# Patient Record
Sex: Male | Born: 1960 | Race: White | Hispanic: No | Marital: Married | Smoking: Former smoker
Health system: Southern US, Community
[De-identification: ages and names within clinical notes are randomized; demographics above are authoritative.]

## PROBLEM LIST (undated history)

## (undated) DIAGNOSIS — I10 Essential (primary) hypertension: Secondary | ICD-10-CM

## (undated) DIAGNOSIS — Z9189 Other specified personal risk factors, not elsewhere classified: Secondary | ICD-10-CM

## (undated) DIAGNOSIS — Z8489 Family history of other specified conditions: Secondary | ICD-10-CM

## (undated) DIAGNOSIS — E785 Hyperlipidemia, unspecified: Secondary | ICD-10-CM

## (undated) DIAGNOSIS — Z973 Presence of spectacles and contact lenses: Secondary | ICD-10-CM

## (undated) DIAGNOSIS — K648 Other hemorrhoids: Secondary | ICD-10-CM

## (undated) DIAGNOSIS — J302 Other seasonal allergic rhinitis: Secondary | ICD-10-CM

## (undated) DIAGNOSIS — N200 Calculus of kidney: Secondary | ICD-10-CM

## (undated) DIAGNOSIS — J3089 Other allergic rhinitis: Secondary | ICD-10-CM

## (undated) HISTORY — DX: Essential (primary) hypertension: I10

## (undated) HISTORY — PX: COLONOSCOPY: SHX174

## (undated) HISTORY — DX: Calculus of kidney: N20.0

---

## 2008-03-03 ENCOUNTER — Ambulatory Visit: Payer: Self-pay | Admitting: Internal Medicine

## 2008-03-03 ENCOUNTER — Ambulatory Visit: Payer: Self-pay

## 2008-03-03 LAB — CONVERTED CEMR LAB: TSH: 1.19 microintl units/mL (ref 0.35–5.50)

## 2008-03-09 ENCOUNTER — Ambulatory Visit (HOSPITAL_COMMUNITY): Admission: RE | Admit: 2008-03-09 | Discharge: 2008-03-09 | Payer: Self-pay | Admitting: Internal Medicine

## 2008-03-17 ENCOUNTER — Encounter: Payer: Self-pay | Admitting: Internal Medicine

## 2008-03-17 ENCOUNTER — Ambulatory Visit: Payer: Self-pay

## 2008-03-17 HISTORY — PX: TRANSTHORACIC ECHOCARDIOGRAM: SHX275

## 2008-03-30 ENCOUNTER — Other Ambulatory Visit: Admission: RE | Admit: 2008-03-30 | Discharge: 2008-03-30 | Payer: Self-pay | Admitting: Interventional Radiology

## 2008-03-30 ENCOUNTER — Encounter: Admission: RE | Admit: 2008-03-30 | Discharge: 2008-03-30 | Payer: Self-pay | Admitting: Internal Medicine

## 2008-03-31 ENCOUNTER — Encounter (INDEPENDENT_AMBULATORY_CARE_PROVIDER_SITE_OTHER): Payer: Self-pay | Admitting: Interventional Radiology

## 2008-06-08 ENCOUNTER — Ambulatory Visit: Payer: Self-pay | Admitting: Internal Medicine

## 2008-06-08 LAB — CONVERTED CEMR LAB
ALT: 43 units/L (ref 0–53)
AST: 39 units/L — ABNORMAL HIGH (ref 0–37)
Alkaline Phosphatase: 50 units/L (ref 39–117)
Calcium: 9.1 mg/dL (ref 8.4–10.5)
Cholesterol: 178 mg/dL (ref 0–200)
Creatinine, Ser: 1.1 mg/dL (ref 0.4–1.5)
HDL: 43.4 mg/dL (ref >39.00)
LDL Cholesterol: 118 mg/dL — ABNORMAL HIGH (ref 0–99)
Total Bilirubin: 0.8 mg/dL (ref 0.3–1.2)
Total CHOL/HDL Ratio: 4
Triglycerides: 83 mg/dL (ref 0.0–149.0)

## 2008-07-26 ENCOUNTER — Ambulatory Visit: Payer: Self-pay | Admitting: Internal Medicine

## 2008-07-26 DIAGNOSIS — E785 Hyperlipidemia, unspecified: Secondary | ICD-10-CM

## 2008-10-13 ENCOUNTER — Encounter: Payer: Self-pay | Admitting: Internal Medicine

## 2008-12-16 ENCOUNTER — Ambulatory Visit: Payer: Self-pay | Admitting: Internal Medicine

## 2008-12-16 DIAGNOSIS — J309 Allergic rhinitis, unspecified: Secondary | ICD-10-CM

## 2008-12-16 DIAGNOSIS — R42 Dizziness and giddiness: Secondary | ICD-10-CM | POA: Insufficient documentation

## 2008-12-28 ENCOUNTER — Telehealth: Payer: Self-pay | Admitting: Internal Medicine

## 2009-01-04 ENCOUNTER — Encounter (INDEPENDENT_AMBULATORY_CARE_PROVIDER_SITE_OTHER): Payer: Self-pay | Admitting: *Deleted

## 2009-04-13 ENCOUNTER — Encounter: Payer: Self-pay | Admitting: Internal Medicine

## 2009-04-14 ENCOUNTER — Ambulatory Visit: Payer: Self-pay | Admitting: Family Medicine

## 2009-04-14 DIAGNOSIS — R109 Unspecified abdominal pain: Secondary | ICD-10-CM

## 2009-04-14 LAB — CONVERTED CEMR LAB
Glucose, Urine, Semiquant: NEGATIVE
Nitrite: NEGATIVE
Protein, U semiquant: NEGATIVE
Specific Gravity, Urine: 1.01
Urobilinogen, UA: 0.2

## 2009-04-17 ENCOUNTER — Encounter: Admission: RE | Admit: 2009-04-17 | Discharge: 2009-04-17 | Payer: Self-pay | Admitting: Allergy and Immunology

## 2010-01-08 ENCOUNTER — Telehealth: Payer: Self-pay | Admitting: Internal Medicine

## 2010-01-30 ENCOUNTER — Ambulatory Visit: Payer: Self-pay | Admitting: Internal Medicine

## 2010-01-30 LAB — CONVERTED CEMR LAB
AST: 39 units/L — ABNORMAL HIGH (ref 0–37)
Albumin: 3.9 g/dL (ref 3.5–5.2)
Basophils Absolute: 0 10*3/uL (ref 0.0–0.1)
Basophils Relative: 0.3 % (ref 0.0–3.0)
CO2: 28 meq/L (ref 19–32)
Cholesterol: 190 mg/dL (ref 0–200)
Eosinophils Relative: 2 % (ref 0.0–5.0)
GFR calc non Af Amer: 95.15 mL/min (ref >60.00)
Glucose, Bld: 106 mg/dL — ABNORMAL HIGH (ref 70–99)
Hemoglobin: 14 g/dL (ref 13.0–17.0)
Ketones, ur: NEGATIVE mg/dL
Leukocytes, UA: NEGATIVE
Lymphocytes Relative: 31.8 % (ref 12.0–46.0)
Lymphs Abs: 2.2 10*3/uL (ref 0.7–4.0)
MCHC: 34.8 g/dL (ref 30.0–36.0)
Monocytes Relative: 8.2 % (ref 3.0–12.0)
Neutro Abs: 4 10*3/uL (ref 1.4–7.7)
Nitrite: NEGATIVE
PSA: 1.61 ng/mL (ref 0.10–4.00)
Platelets: 287 10*3/uL (ref 150.0–400.0)
RDW: 12.4 % (ref 11.5–14.6)
Total Bilirubin: 0.7 mg/dL (ref 0.3–1.2)
Triglycerides: 147 mg/dL (ref 0.0–149.0)
VLDL: 29.4 mg/dL (ref 0.0–40.0)
WBC: 6.9 10*3/uL (ref 4.5–10.5)

## 2010-02-05 ENCOUNTER — Ambulatory Visit: Payer: Self-pay | Admitting: Internal Medicine

## 2010-02-13 ENCOUNTER — Ambulatory Visit: Payer: Self-pay | Admitting: Internal Medicine

## 2010-02-13 DIAGNOSIS — J069 Acute upper respiratory infection, unspecified: Secondary | ICD-10-CM | POA: Insufficient documentation

## 2010-02-13 DIAGNOSIS — J029 Acute pharyngitis, unspecified: Secondary | ICD-10-CM

## 2010-03-27 NOTE — Letter (Signed)
Summary: [facility]  [facility]   Imported By: Sherian Rein 04/27/2009 13:40:16  _____________________________________________________________________  External Attachment:    Type:   Image     Comment:   External Document

## 2010-03-27 NOTE — Progress Notes (Signed)
Summary: refill simvastatin  Phone Note Refill Request Message from:  Fax from Pharmacy on January 08, 2010 4:04 PM  Refills Requested: Medication #1:  SIMVASTATIN 40 MG TABS Take one tablet by mouth daily at bedtime vs [facility]   Method Requested: Fax to Local Pharmacy Initial call taken by: Duard Brady LPN,  January 08, 2010 4:05 PM    Prescriptions: SIMVASTATIN 40 MG TABS (SIMVASTATIN) Take one tablet by mouth daily at bedtime  #90 x 0   Entered by:   Duard Brady LPN   Authorized by:   Gordy Savers  MD   Signed by:   Duard Brady LPN on 48/54/6270   Method used:   Historical   RxID:   [id number]  [facility]ed back to [facility]   KIk  will need to be seen - last seen 11/2008

## 2010-03-27 NOTE — Assessment & Plan Note (Signed)
Summary: low back and flank pain/dm   Vital Signs:  Patient profile:   50 year old male Temp:     99.0 degrees F oral BP sitting:   140 / 88  (left arm) Cuff size:   regular  Vitals Entered By: Sid Falcon LPN (April 14, 2009 1:14 PM) CC: Severe stomach, back pains X 2 days Pain Assessment Patient in pain? yes     Location: abdomen Intensity: 8 Type: dull   History of Present Illness: Acute visit. Left flank pain which started 3 AM yesterday. 8/10 severity. Sharp to severe achy pain. Symptoms somewhat off and on since then. Heat might have helped slightly. No clear exacerbating features. Denies any urinary symptoms, gross hematuria, fever, or vomiting. Has had some mild nausea. No stool changes. No history of kidney stones.  Pain severe at onset, subsided for several hours then recurrent severe pain episode this AM.  Currently minimal pain.  Radiated somewhat anterior.  Allergies (verified): No Known Drug Allergies  Past History:  Past Medical History: Last updated: 12/16/2008 1. ETT, January 2010     --13 minutes n Kynsie Falkner. Negative ECG     --ECHO: EF 60% 2. 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 Hyperlipidemia Allergic rhinitis positional vertigo  Social History: Last updated: 07/23/2008  He is married.  He has 3 kids.  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  He drinks 2 cups of large coffee a day.  He   does some exercise as described above.     PMH reviewed for relevance, PSH reviewed for relevance  Review of Systems  The patient denies anorexia, fever, weight loss, chest pain, syncope, dyspnea on exertion, peripheral edema, hemoptysis, melena, hematochezia, severe indigestion/heartburn, hematuria, and incontinence.    Physical Exam  General:  Well-developed,well-nourished,in no acute distress; alert,appropriate and cooperative throughout examination Mouth:  Oral mucosa and  oropharynx without lesions or exudates.  Teeth in good repair. Neck:  No deformities, masses, or tenderness noted. Lungs:  Normal respiratory effort, chest expands symmetrically. Lungs are clear to auscultation, no crackles or wheezes. Heart:  Normal rate and regular rhythm. S1 and S2 normal without gallop, murmur, click, rub or other extra sounds. Abdomen:  soft, non-tender, normal bowel sounds, no distention, no masses, no guarding, no rigidity, no hepatomegaly, and no splenomegaly.   Msk:  no CVA tenderness Psych:  normally interactive, good eye contact, not anxious appearing, and not depressed appearing.     Impression & Recommendations:  Problem # 1:  FLANK PAIN (ICD-789.09)  urine dipstick reveals moderate blood. Negative for nitrites and leukocytes. Rule out kidney stone. Sent for plain films. Pain medication prescribed. CT urogram or urologic referral if no better by early next week Orders: UA Dipstick w/o Micro (manual) (16109) T-1 View Abdomen (KUB) (74000TC)  His updated medication list for this problem includes:    Oxycodone Hcl 10 Mg Tabs (Oxycodone hcl) ..... One by mouth q 6 hours as needed pain  Complete Medication List: 1)  Simvastatin 40 Mg Tabs (Simvastatin) .... Take one tablet by mouth daily at bedtime 2)  Oxycodone Hcl 10 Mg Tabs (Oxycodone hcl) .... One by mouth q 6 hours as needed pain  Patient Instructions: 1)   drink lots of fluids. 2)  follow up promptly if he develops any fever or worsening symptoms 3)  Call or touch based on Monday if symptoms not resolving Prescriptions:  OXYCODONE HCL 10 MG TABS (OXYCODONE HCL) one by mouth q 6 hours as needed pain  #20 x 0   Entered and Authorized by:   Evelena Peat MD   Signed by:   Evelena Peat MD on 04/14/2009   Method used:   Print then Give to Patient   RxID:   [id number]   Laboratory Results   Urine Tests    Routine Urinalysis   Color: yellow Appearance: Clear Glucose: negative   (Normal  Range: Negative) Bilirubin: negative   (Normal Range: Negative) Ketone: negative   (Normal Range: Negative) Spec. Gravity: 1.010   (Normal Range: 1.003-1.035) Blood: moderate   (Normal Range: Negative) pH: 6.0   (Normal Range: 5.0-8.0) Protein: negative   (Normal Range: Negative) Urobilinogen: 0.2   (Normal Range: 0-1) Nitrite: negative   (Normal Range: Negative) Leukocyte Esterace: negative   (Normal Range: Negative)    Comments: Sid Falcon LPN  April 14, 2009 2:04 PM

## 2010-03-29 NOTE — Assessment & Plan Note (Signed)
Summary: CPX // RS   Vital Signs:  Patient profile:   50 year old male Height:      66.5 inches Weight:      194 pounds BMI:     30.95 Temp:     98.3 degrees F oral BP sitting:   130 / 70  (left arm) Cuff size:   regular  Vitals Entered By: Duard Brady LPN (February 05, 2010 1:21 PM) CC: cpx---doing fine  Is Patient Diabetic? No   CC:  cpx---doing fine .  History of Present Illness: 50 year old patient who is seen today for follow-up and for an annual exam.  Complaints include some fatigue and weight gain.  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  Weight is up to 194.  He has a history of mild dyslipidemia, controlled with simvastatin therapy.  Allergies (verified): No Known Drug Allergies  Past History:  Past Medical History: Reviewed history from 12/16/2008 and no changes required. 1. ETT, January 2010     --13 minutes n Bruce. Negative ECG     --ECHO: EF 60% 2. 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 Hyperlipidemia Allergic rhinitis positional vertigo  Past Surgical History: coclonoscopy age 66  Family History: Reviewed history from 12/16/2008 and no changes required.  Father is alive, had a heart attack in his 59s.  Mother   is alive and well.  He has 2 sisters.  There is no family history of   premature coronary artery disease or sudden cardiac death.     both parents are age 86; mother.  History melanoma, as well as basal cell skin cancer;   mother and a sister, status post lumpectomy for breast cancer  Social History: Reviewed history from 07/23/2008 and no changes required.  He is married.  He has 3 kids.  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  He drinks 2 cups of large coffee a day.  He   does some exercise as described above.      Review of Systems       The patient complains of weight gain.  The patient denies anorexia, fever,  weight loss, vision loss, decreased hearing, hoarseness, chest pain, syncope, dyspnea on exertion, peripheral edema, prolonged cough, headaches, hemoptysis, abdominal pain, melena, hematochezia, severe indigestion/heartburn, hematuria, incontinence, genital sores, muscle weakness, suspicious skin lesions, transient blindness, difficulty walking, depression, unusual weight change, abnormal bleeding, enlarged lymph nodes, angioedema, breast masses, and testicular masses.    Physical Exam  General:  overweight-appearing.  122/82overweight-appearing.   Head:  Normocephalic and atraumatic without obvious abnormalities. No apparent alopecia or balding. Eyes:  No corneal or conjunctival inflammation noted. EOMI. Perrla. Funduscopic exam benign, without hemorrhages, exudates or papilledema. Vision grossly normal. Ears:  External ear exam shows no significant lesions or deformities.  Otoscopic examination reveals clear canals, tympanic membranes are intact bilaterally without bulging, retraction, inflammation or discharge. Hearing is grossly normal bilaterally. Nose:  External nasal examination shows no deformity or inflammation. Nasal mucosa are pink and moist without lesions or exudates. Mouth:  Oral mucosa and oropharynx without lesions or exudates.  Teeth in good repair. Neck:  No deformities, masses, or tenderness noted. Chest Wall:  No deformities, masses, tenderness or gynecomastia noted. Breasts:  No masses or gynecomastia noted Lungs:  Normal respiratory effort, chest expands symmetrically. Lungs are clear to auscultation, no crackles or wheezes. Heart:  Normal rate and regular rhythm. S1 and S2 normal without gallop, murmur, click, rub or other extra sounds. Abdomen:  Bowel sounds positive,abdomen soft and non-tender without masses, organomegaly or hernias noted. Rectal:  external hemorrhoid(s).  external hemorrhoid(s).   Genitalia:  Testes bilaterally descended without nodularity, tenderness or  masses. No scrotal masses or lesions. No penis lesions or urethral discharge. Prostate:  Prostate gland firm and smooth, no enlargement, nodularity, tenderness, mass, asymmetry or induration. Msk:  No deformity or scoliosis noted of thoracic or lumbar spine.   Pulses:  R and L carotid,radial,femoral,dorsalis pedis and posterior tibial pulses are full and equal bilaterally Extremities:  No clubbing, cyanosis, edema, or deformity noted with normal full range of motion of all joints.   Neurologic:  No cranial nerve deficits noted. Station and gait are normal. Plantar reflexes are down-going bilaterally. DTRs are symmetrical throughout. Sensory, motor and coordinative functions appear intact. Skin:  Intact without suspicious lesions or rashes Cervical Nodes:  No lymphadenopathy noted Axillary Nodes:  No palpable lymphadenopathy Inguinal Nodes:  No significant adenopathy Psych:  Cognition and judgment appear intact. Alert and cooperative with normal attention span and concentration. No apparent delusions, illusions, hallucinations   Impression & Recommendations:  Problem # 1:  Preventive Health Care (ICD-V70.0)  Complete Medication List: 1)  Simvastatin 40 Mg Tabs (Simvastatin) .... Take one tablet by mouth daily at bedtime  Other Orders: Flu Vaccine 74yrs + (04540) Admin 1st Vaccine (98119)  Patient Instructions: 1)  Please schedule a follow-up appointment in 1 year. 2)  Limit your Sodium (Salt). 3)  It is important that you exercise regularly at least 20 minutes 5 times a week. If you develop chest pain, have severe difficulty breathing, or feel very tired , stop exercising immediately and seek medical attention. 4)  You need to lose weight. Consider a lower calorie diet and regular exercise.  Prescriptions: SIMVASTATIN 40 MG TABS (SIMVASTATIN) Take one tablet by mouth daily at bedtime  #90 x 6   Entered and Authorized by:   Gordy Savers  MD   Signed by:   Gordy Savers  MD  on 02/05/2010   Method used:   Print then Give to Patient   RxID:   [id number] SIMVASTATIN 40 MG TABS (SIMVASTATIN) Take one tablet by mouth daily at bedtime  #90 x 6   Entered and Authorized by:   Gordy Savers  MD   Signed by:   Gordy Savers  MD on 02/05/2010   Method used:   Electronically to        [facility]. [phone]* (retail)       [phone] [address]       [place], [place]  [address]       Ph: [phone]       Fax: [phone]   RxID:   [id number]    Orders Added: 1)  Flu Vaccine 49yrs + [88416] 2)  Admin 1st Vaccine [90471] 3)  Est. Patient 40-64 years [99396]   Immunizations Administered:  Influenza Vaccine # 1:    Vaccine Type: Fluvax 3+    Site: left deltoid    Mfr: GlaxoSmithKline    Dose: 0.5 ml    Route: IM    Given by: Duard Brady LPN    Exp. Date: 08/25/2010    Lot #: [id number]    VIS given: 09/19/09 version given February 05, 2010.    Physician counseled: yes  Flu  Vaccine Consent Questions:    Do you have a history of severe allergic reactions to this vaccine? no    Any prior history of allergic reactions to egg and/or gelatin? no    Do you have a sensitivity to the preservative Thimersol? no    Do you have a past history of Guillan-Barre Syndrome? no    Do you currently have an acute febrile illness? no    Have you ever had a severe reaction to latex? no    Vaccine information given and explained to patient? yes   Immunizations Administered:  Influenza Vaccine # 1:    Vaccine Type: Fluvax 3+    Site: left deltoid    Mfr: GlaxoSmithKline    Dose: 0.5 ml    Route: IM    Given by: Duard Brady LPN    Exp. Date: 08/25/2010    Lot #: [id number]    VIS given: 09/19/09 version given February 05, 2010.    Physician counseled: yes

## 2010-03-29 NOTE — Assessment & Plan Note (Signed)
Summary: ?strep stress/cjr   Vital Signs:  Patient profile:   50 year old male Weight:      191 pounds Temp:     98.5 degrees F oral BP sitting:   112 / 78  (right arm) Cuff size:   regular  Vitals Entered By: Duard Brady LPN (February 13, 2010 1:55 PM) CC: c/o sore throat , 2 kids with strep Is Patient Diabetic? No   CC:  c/o sore throat  and 2 kids with strep.  History of Present Illness: 50  -year-old patient, who presents with a mild sore throatcough, mild rhinorrhea.  Two young children have been diagnosed and are on penicillin for streptococcal pharyngitis.  He also describes mild myalgias.  No documented fever or cervical adenopathy.  He is on simvastatin for dyslipidemia.  He was seen here 8 days ago for an annual exam  Allergies (verified): No Known Drug Allergies  Review of Systems       The patient complains of hoarseness and prolonged cough.  The patient denies anorexia, fever, weight loss, weight gain, vision loss, decreased hearing, chest pain, syncope, dyspnea on exertion, peripheral edema, headaches, hemoptysis, abdominal pain, melena, hematochezia, severe indigestion/heartburn, hematuria, incontinence, genital sores, muscle weakness, suspicious skin lesions, transient blindness, difficulty walking, depression, unusual weight change, abnormal bleeding, enlarged lymph nodes, angioedema, breast masses, and testicular masses.    Physical Exam  General:  Well-developed,well-nourished,in no acute distress; alert,appropriate and cooperative throughout examination Head:  Normocephalic and atraumatic without obvious abnormalities. No apparent alopecia or balding. Eyes:  No corneal or conjunctival inflammation noted. EOMI. Perrla. Funduscopic exam benign, without hemorrhages, exudates or papilledema. Vision grossly normal. Ears:  External ear exam shows no significant lesions or deformities.  Otoscopic examination reveals clear canals, tympanic membranes are intact  bilaterally without bulging, retraction, inflammation or discharge. Hearing is grossly normal bilaterally. Mouth:  marked tonsillar enlargement, but minimal erythema Neck:  no adenopathy Lungs:  Normal respiratory effort, chest expands symmetrically. Lungs are clear to auscultation, no crackles or wheezes.   Impression & Recommendations:  Problem # 1:  URI (ICD-465.9)  Problem # 2:  SORE THROAT (ICD-462)  His updated medication list for this problem includes:    Amoxicillin 500 Mg Caps (Amoxicillin) ..... One capsule 3 times daily with meals patient has an impressive strep exposure.  Will give a prescription for an antibiotic, which he will not fill unless he develops worsening sore throat or fever  His updated medication list for this problem includes:    Amoxicillin 500 Mg Caps (Amoxicillin) ..... One capsule 3 times daily with meals  Complete Medication List: 1)  Simvastatin 40 Mg Tabs (Simvastatin) .... Take one tablet by mouth daily at bedtime 2)  Amoxicillin 500 Mg Caps (Amoxicillin) .... One capsule 3 times daily with meals  Other Orders: Rapid Strep (16109)  Patient Instructions: 1)  Take 400-600mg  of Ibuprofen (Advil, Motrin) with food every 4-6 hours as needed for relief of pain or comfort of fever. 2)  Please schedule a follow-up appointment as needed. Prescriptions: AMOXICILLIN 500 MG CAPS (AMOXICILLIN) one capsule 3 times daily with meals  #21 x 0   Entered and Authorized by:   Gordy Savers  MD   Signed by:   Gordy Savers  MD on 02/13/2010   Method used:   Print then Give to Patient   RxID:   [id number]    Orders Added: 1)  Rapid Strep [95621] 2)  Est. Patient Level II [30865]  Appended Document: ?  strep stress/cjr    Clinical Lists Changes  Observations: Added new observation of RAPID STREP: negative (02/21/2010 9:10)      Laboratory Results  Date/Time Received: 02/13/2010 Date/Time Reported: 02/13/2010  Other Tests  Rapid  Strep: negative

## 2010-03-30 ENCOUNTER — Ambulatory Visit (INDEPENDENT_AMBULATORY_CARE_PROVIDER_SITE_OTHER): Payer: Self-pay | Admitting: Internal Medicine

## 2010-03-30 ENCOUNTER — Encounter: Payer: Self-pay | Admitting: Internal Medicine

## 2010-03-30 VITALS — BP 110/80 | Temp 99.3°F | Ht 68.0 in | Wt 194.0 lb

## 2010-03-30 DIAGNOSIS — J069 Acute upper respiratory infection, unspecified: Secondary | ICD-10-CM

## 2010-03-30 MED ORDER — HYDROCODONE-ACETAMINOPHEN 5-500 MG PO TABS: 1.0000 | ORAL_TABLET | ORAL | Status: AC | PRN

## 2010-03-30 NOTE — Patient Instructions (Signed)
Get plenty of rest, Drink lots of  clear liquids, and use Tylenol or ibuprofen for fever and discomfort.   Take 400-600 mg of ibuprofen ( Advil, Motrin) with food every 4 to 6 hours as needed for pain relief or control of fever

## 2010-03-30 NOTE — Progress Notes (Signed)
  Subjective:    Patient ID: Jesse Horne, male    DOB: [birth year], 50 y.o.   MRN: [id number]  HPI  50 year old patient with a two-day history of URI symptoms.  He has had rhinorrhea red, watery eyes, mild sore throat, and nonproductive cough.  He has had fever head, and chest congestion, and a general sense of fatigue and muscle aches.  He has been using some Advil and Tylenol with marginal benefit.  No strep exposure.  He does have a history of allergic rhinitis    Review of Systems  Constitutional: Positive for fever, chills and fatigue. Negative for appetite change.  HENT: Positive for congestion and rhinorrhea. Negative for hearing loss, ear pain, sore throat, trouble swallowing, neck stiffness, dental problem, voice change and tinnitus.   Eyes: Negative for pain, discharge and visual disturbance.  Respiratory: Positive for cough. Negative for chest tightness, wheezing and stridor.   Cardiovascular: Negative for chest pain, palpitations and leg swelling.  Gastrointestinal: Negative for nausea, vomiting, abdominal pain, diarrhea, constipation, blood in stool and abdominal distention.  Genitourinary: Negative for urgency, hematuria, flank pain, discharge, difficulty urinating and genital sores.  Musculoskeletal: Positive for myalgias. Negative for back pain, joint swelling, arthralgias and gait problem.  Skin: Negative for rash.  Neurological: Negative for dizziness, syncope, speech difficulty, weakness, numbness and headaches.  Hematological: Negative for adenopathy. Does not bruise/bleed easily.  Psychiatric/Behavioral: Negative for behavioral problems and dysphoric mood. The patient is not nervous/anxious.        Objective:   Physical Exam  Constitutional: He is oriented to person, place, and time. He appears well-developed.  HENT:  Head: Normocephalic and atraumatic.  Right Ear: External ear normal.  Left Ear: External ear normal.       Oropharynx was erythematous with tonsillar  enlargement.  No cervical adenopathy  Eyes: Conjunctivae and EOM are normal.  Neck: Normal range of motion. Neck supple.  Cardiovascular: Normal rate, regular rhythm and normal heart sounds.   Pulmonary/Chest: Breath sounds normal. No respiratory distress. He has no wheezes. He has no rales. He exhibits no tenderness.  Abdominal: Bowel sounds are normal. He exhibits no distension. There is no tenderness.  Musculoskeletal: Normal range of motion. He exhibits no edema and no tenderness.  Lymphadenopathy:    He has no cervical adenopathy.  Neurological: He is alert and oriented to person, place, and time.  Skin: Skin is dry. No rash noted.  Psychiatric: He has a normal mood and affect. His behavior is normal.          Assessment & Plan:   1  URI; will to symptomatically was given a prescription for Tylenol with hydrocodone for fever and pain and cough control.  Samples of Clarinex, dispensed he will also use ibuprofen p.r.n.

## 2010-06-18 ENCOUNTER — Other Ambulatory Visit: Payer: Self-pay

## 2010-06-18 ENCOUNTER — Ambulatory Visit: Payer: Private Health Insurance - Indemnity | Admitting: Internal Medicine

## 2010-06-18 DIAGNOSIS — Z0289 Encounter for other administrative examinations: Secondary | ICD-10-CM

## 2010-06-18 MED ORDER — SIMVASTATIN 40 MG PO TABS: 40.0000 mg | ORAL_TABLET | Freq: Every day | ORAL | Status: DC

## 2010-06-18 NOTE — Telephone Encounter (Signed)
Faxed back to [facility]

## 2010-07-10 NOTE — Assessment & Plan Note (Signed)
[facility]                            CARDIOLOGY OFFICE NOTE   NAME:Horne, Jesse                         MRN:          [id number]  DATE:03/03/2008                            DOB:          [birth year]    REASON FOR VISIT:  Palpitations and cardiovascular screening.   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  I have  been asked to see him due to his palpitations and for cardiovascular  screening.   Jesse Horne denies any known history of cardiac disease.  He has never had a  stress test or cardiac catheterization, for about the past 4-5 years, he  has been treated for hyperlipidemia.  His LDL has ranged from 90-140.  He denies any history of hypertension or diabetes.   He is fairly active and has been recently started exercising at the gym.  He denies any chest pain or shortness of breath.  However, he has  noticed a lot of extra beats.  He says that these were just very  transient, I feel an extra beat, they will go away and again they will  come back.  He has not had any sustained palpitations.  No syncope or  presyncope.  He has not had any heart failure symptoms and no chest pain  whatsoever.   REVIEW OF SYSTEMS:  He does have some back pain as he recently hurt his  back shoveling snow.  He also has allergies and hay fever.  The  remainder review of systems is negative except for HPI.   PROBLEM LIST:  Hyperlipidemia.   CURRENT MEDICATIONS:  Simvastatin 40 a day and hydrocodone for his back  pain.   ALLERGIES:  None.   SOCIAL HISTORY:  He is married.  He has 3 kids.  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  He drinks 2 cups of large coffee a day.  He  does some exercise as described above.   FAMILY HISTORY:  Father is alive, had a heart attack in his 27s.  Mother  is alive and well.  He  has 2 sisters.  There is no family history of  premature coronary artery disease or sudden cardiac death.   PHYSICAL EXAMINATION:  GENERAL:  He looks well.  He is in no acute  distress, ambulates in the clinic without any respiratory difficulty.  VITAL SIGNS:  Blood pressure is 128/90, heart rate 78, and weight is  192.  HEENT:  Normal.  NECK:  Supple.  There is no JVD.  Carotids are 2+ bilaterally.  No  bruits.  He does look like he has some symmetrical perhaps mild thyroid  enlargement.  I do not feel a nodule, however, there is no  lymphadenopathy.  CARDIAC:  PMI is nondisplaced.  Regular rate and rhythm.  No murmurs,  rubs, or gallops.  LUNGS:  Clear.  ABDOMEN:  Soft, nontender, and nondistended.  No hepatosplenomegaly, no  bruits, or no  masses.  Good bowel sounds.  EXTREMITIES:  Warm with no cyanosis, clubbing, or edema.  No rash.  NEUROLOGIC:  Alert and oriented x3.  Cranial nerves II-XII are intact.  Moves all 4 extremities without difficulty.  Affect is pleasant.  EKG  shows sinus rhythm at rate of 78, normal axis and intervals.  No ST-T  wave abnormalities.   ASSESSMENT AND PLAN:  1. Palpitations.  I suspect that these are premature atrial      contractions due to caffeine and stress.  However, we will get an      echocardiograms to make sure, he has a structurally normal heart.      Given his possible mild thyroid enlargement, we will also check      thyroid panel as well as his thyroid ultrasound.  2. Hyperlipidemia.  Goal LDL is less than 100.  I reminded him to      watch his diet a little more closely.  We will continue      simvastatin.  We will recheck his lipids and a CRP in the near      future.  3. Borderline hypertension.  Blood pressure is a little bit up.  I      remind him to watch his salt.  We will follow this with home blood      pressure monitoring and have a low threshold to act on this.  4. Cardiovascular screening.  Given his age, I do think it is       reasonable to proceed with a routine treadmill test to further      evaluate.     Bevelyn Buckles. Bensimhon, MD  Electronically Signed    DRB/MedQ  DD: 03/03/2008  DT: 03/04/2008  Job #: [id number]

## 2010-07-10 NOTE — Procedures (Signed)
[facility]                              EXERCISE TREADMILL   NAME:Jesse Horne, Jesse Horne                         MRN:          [id number]  DATE:03/03/2008                            DOB:          Sep 03, 1960    REASON:  For cardiovascular screening.   Baseline EKG shows normal sinus rhythm, rate 80.  Resting blood pressure  was 134/89.   DESCRIPTION OF THE TEST:  Jesse Horne walked for 13 minutes on a standard  Bruce protocol.  He stopped due to fatigue.  There is no chest pain or  undue dyspnea.  Total METS was 15.1.  The peak heart rate was 184 which  was 117% maximum predicted heart rate.  There were no ST-T wave  abnormalities with exercise.   Heart rate at the end of the first minute was about 165 showing good  heart rate recovery.   ASSESSMENT:  This is normal exercise treadmill test with excellent  exercise capacity with no evidence of ischemia.  There is good heart  rate recovery.     Bevelyn Buckles. Bensimhon, MD  Electronically Signed    DRB/MedQ  DD: 03/03/2008  DT: 03/04/2008  Job #: [id number]

## 2011-03-29 ENCOUNTER — Encounter: Payer: Self-pay | Admitting: Internal Medicine

## 2011-03-29 ENCOUNTER — Ambulatory Visit (INDEPENDENT_AMBULATORY_CARE_PROVIDER_SITE_OTHER): Payer: Private Health Insurance - Indemnity | Admitting: Internal Medicine

## 2011-03-29 ENCOUNTER — Telehealth: Payer: Self-pay | Admitting: Internal Medicine

## 2011-03-29 VITALS — BP 124/80 | HR 76 | Temp 98.3°F | Wt 184.0 lb

## 2011-03-29 DIAGNOSIS — E785 Hyperlipidemia, unspecified: Secondary | ICD-10-CM

## 2011-03-29 DIAGNOSIS — Z23 Encounter for immunization: Secondary | ICD-10-CM

## 2011-03-29 MED ORDER — SIMVASTATIN 40 MG PO TABS: 40.0000 mg | ORAL_TABLET | Freq: Every day | ORAL | Status: DC

## 2011-03-29 NOTE — Telephone Encounter (Signed)
Needs new rx for Simvastatin 40mg  1qd sent to [facility]. Thanks.

## 2011-03-29 NOTE — Telephone Encounter (Signed)
Pt will come in today to see dr Kirtland Bouchard. Please disregard rx request. Thanks.

## 2011-03-29 NOTE — Patient Instructions (Signed)
Limit your sodium (Salt) intake    It is important that you exercise regularly, at least 20 minutes 3 to 4 times per week.  If you develop chest pain or shortness of breath seek  medical attention. 

## 2011-03-29 NOTE — Progress Notes (Signed)
  Subjective:    Patient ID: Jesse Horne, male    DOB: November 25, 1960, 51 y.o.   MRN: [id number]  HPI  51 year old patient who has a long history of dyslipidemia controlled with simvastatin for some years. He is doing quite well today. He was here for form completion for health maintenance required by his insurance company for premium reduction. No concerns or complaints. Forms were reviewed and completed    Review of Systems  Constitutional: Negative for fever, chills, appetite change and fatigue.  HENT: Negative for hearing loss, ear pain, congestion, sore throat, trouble swallowing, neck stiffness, dental problem, voice change and tinnitus.   Eyes: Negative for pain, discharge and visual disturbance.  Respiratory: Negative for cough, chest tightness, wheezing and stridor.   Cardiovascular: Negative for chest pain, palpitations and leg swelling.  Gastrointestinal: Negative for nausea, vomiting, abdominal pain, diarrhea, constipation, blood in stool and abdominal distention.  Genitourinary: Negative for urgency, hematuria, flank pain, discharge, difficulty urinating and genital sores.  Musculoskeletal: Negative for myalgias, back pain, joint swelling, arthralgias and gait problem.  Skin: Negative for rash.  Neurological: Negative for dizziness, syncope, speech difficulty, weakness, numbness and headaches.  Hematological: Negative for adenopathy. Does not bruise/bleed easily.  Psychiatric/Behavioral: Negative for behavioral problems and dysphoric mood. The patient is not nervous/anxious.        Objective:   Physical Exam  Constitutional: He is oriented to person, place, and time. He appears well-developed.  HENT:  Head: Normocephalic.  Right Ear: External ear normal.  Left Ear: External ear normal.  Eyes: Conjunctivae and EOM are normal.  Neck: Normal range of motion.  Cardiovascular: Normal rate and normal heart sounds.   Pulmonary/Chest: Breath sounds normal.  Abdominal: Bowel  sounds are normal.  Musculoskeletal: Normal range of motion. He exhibits no edema and no tenderness.  Neurological: He is alert and oriented to person, place, and time.  Psychiatric: He has a normal mood and affect. His behavior is normal.          Assessment & Plan:    Hypercholesterolemia. Form completed and medications refilled. He is asked to return in one year for an annual exam and laboratory update

## 2011-09-17 ENCOUNTER — Ambulatory Visit (INDEPENDENT_AMBULATORY_CARE_PROVIDER_SITE_OTHER): Payer: Private Health Insurance - Indemnity | Admitting: Internal Medicine

## 2011-09-17 ENCOUNTER — Encounter: Payer: Self-pay | Admitting: Internal Medicine

## 2011-09-17 VITALS — BP 120/80 | Wt 186.0 lb

## 2011-09-17 DIAGNOSIS — Z789 Other specified health status: Secondary | ICD-10-CM

## 2011-09-17 DIAGNOSIS — E785 Hyperlipidemia, unspecified: Secondary | ICD-10-CM

## 2011-09-17 LAB — CBC WITH DIFFERENTIAL/PLATELET
Basophils Relative: 0.4 % (ref 0.0–3.0)
Eosinophils Absolute: 0.1 10*3/uL (ref 0.0–0.7)
HCT: 42.5 % (ref 39.0–52.0)
Lymphs Abs: 2 10*3/uL (ref 0.7–4.0)
MCHC: 33.1 g/dL (ref 30.0–36.0)
MCV: 91.3 fl (ref 78.0–100.0)
Monocytes Absolute: 0.5 10*3/uL (ref 0.1–1.0)
Neutrophils Relative %: 60.6 % (ref 43.0–77.0)
Platelets: 291 10*3/uL (ref 150.0–400.0)

## 2011-09-17 LAB — LIPID PANEL
HDL: 54.5 mg/dL (ref >39.00)
Total CHOL/HDL Ratio: 4
VLDL: 38.6 mg/dL (ref 0.0–40.0)

## 2011-09-17 LAB — LDL CHOLESTEROL, DIRECT: Direct LDL: 113.5 mg/dL

## 2011-09-17 LAB — COMPREHENSIVE METABOLIC PANEL
AST: 28 U/L (ref 0–37)
Albumin: 4.3 g/dL (ref 3.5–5.2)
Alkaline Phosphatase: 55 U/L (ref 39–117)
Potassium: 3.9 mEq/L (ref 3.5–5.1)
Sodium: 138 mEq/L (ref 135–145)
Total Protein: 7.4 g/dL (ref 6.0–8.3)

## 2011-09-17 LAB — PSA: PSA: 1.89 ng/mL (ref 0.10–4.00)

## 2011-09-17 MED ORDER — HYDROCORTISONE 2.5 % RE CREA: TOPICAL_CREAM | Freq: Two times a day (BID) | RECTAL | Status: AC

## 2011-09-17 MED ORDER — SIMVASTATIN 40 MG PO TABS: 40.0000 mg | ORAL_TABLET | Freq: Every day | ORAL | Status: DC

## 2011-09-17 NOTE — Progress Notes (Signed)
  Subjective:    Patient ID: Jesse Horne, male    DOB: 06/18/1960, 51 y.o.   MRN: [id number]  HPI  51 year old patient who has a history of dyslipidemia and is in today for followup. No major concerns or complaints. He did have a negative ETT in January 2010.    Review of Systems  Constitutional: Negative for fever, chills, appetite change and fatigue.  HENT: Negative for hearing loss, ear pain, congestion, sore throat, trouble swallowing, neck stiffness, dental problem, voice change and tinnitus.   Eyes: Negative for pain, discharge and visual disturbance.  Respiratory: Negative for cough, chest tightness, wheezing and stridor.   Cardiovascular: Negative for chest pain, palpitations and leg swelling.  Gastrointestinal: Negative for nausea, vomiting, abdominal pain, diarrhea, constipation, blood in stool and abdominal distention.  Genitourinary: Negative for urgency, hematuria, flank pain, discharge, difficulty urinating and genital sores.  Musculoskeletal: Negative for myalgias, back pain, joint swelling, arthralgias and gait problem.  Skin: Negative for rash.  Neurological: Negative for dizziness, syncope, speech difficulty, weakness, numbness and headaches.  Hematological: Negative for adenopathy. Does not bruise/bleed easily.  Psychiatric/Behavioral: Negative for behavioral problems and dysphoric mood. The patient is not nervous/anxious.        Objective:   Physical Exam  Constitutional: He is oriented to person, place, and time. He appears well-developed.  HENT:  Head: Normocephalic.  Right Ear: External ear normal.  Left Ear: External ear normal.  Eyes: Conjunctivae and EOM are normal.  Neck: Normal range of motion.  Cardiovascular: Normal rate and normal heart sounds.   Pulmonary/Chest: Breath sounds normal.  Abdominal: Bowel sounds are normal.  Musculoskeletal: Normal range of motion. He exhibits no edema and no tenderness.  Neurological: He is alert and oriented to  person, place, and time.  Psychiatric: He has a normal mood and affect. His behavior is normal.          Assessment & Plan:   Dyslipidemia. We'll check some updated lab. We'll see in one year for an annual exam a colonoscopy will be scheduled at that time

## 2011-09-17 NOTE — Patient Instructions (Signed)
Limit your sodium (Salt) intake    It is important that you exercise regularly, at least 20 minutes 3 to 4 times per week.  If you develop chest pain or shortness of breath seek  medical attention.  Return in one year for follow-up   

## 2011-09-20 ENCOUNTER — Telehealth: Payer: Self-pay | Admitting: Internal Medicine

## 2011-09-20 NOTE — Telephone Encounter (Signed)
Pt requesting results of labs.

## 2011-09-23 NOTE — Telephone Encounter (Signed)
Please call/notify patient that lab/test/procedure is normal; cholesterol 206 up slightly

## 2011-09-23 NOTE — Telephone Encounter (Signed)
Please advise 

## 2011-09-23 NOTE — Telephone Encounter (Signed)
Attempt to call - VM - LMTCB if questions - gave dr. kwiatkowski's instructions.  

## 2011-09-26 ENCOUNTER — Telehealth: Payer: Self-pay | Admitting: Internal Medicine

## 2011-09-26 NOTE — Telephone Encounter (Signed)
Spoke with pt- informed of lab resutls

## 2011-09-26 NOTE — Telephone Encounter (Signed)
Patient called stating that he would like a call back with lab results. Please assist.  °

## 2011-10-09 ENCOUNTER — Telehealth: Payer: Self-pay | Admitting: Internal Medicine

## 2011-10-09 NOTE — Telephone Encounter (Signed)
Caller: Dakhari/Patient; Patient Name: Jesse Horne; PCP: Eleonore Chiquito; Best Callback Phone Number: [phone].  Caller reports he fell on 10/05/11, has ongoing left shoulder pain.  Pain rated up to 8 of 10; Ibuprofen and Tylenol without relief; also tried expired Hydrocodone without relief.  Emergent symptoms ruled out.  See provider in 4 hours per Shoulder Injury protocol due to pain that limits activity or sleep. No appointments available this date at office.  Note to office for follow up.

## 2011-10-09 NOTE — Telephone Encounter (Signed)
Spoke with pt- pain getting worse - encourage ER if gets worse thru the night - need to be seen tomoorw to eval - transferred to scheduling for appt with another provider

## 2011-10-10 ENCOUNTER — Ambulatory Visit (INDEPENDENT_AMBULATORY_CARE_PROVIDER_SITE_OTHER): Payer: Private Health Insurance - Indemnity | Admitting: Family Medicine

## 2011-10-10 ENCOUNTER — Encounter: Payer: Self-pay | Admitting: Family Medicine

## 2011-10-10 VITALS — BP 130/88 | Temp 98.0°F | Wt 181.0 lb

## 2011-10-10 DIAGNOSIS — S0091XA Abrasion of unspecified part of head, initial encounter: Secondary | ICD-10-CM

## 2011-10-10 DIAGNOSIS — IMO0002 Reserved for concepts with insufficient information to code with codable children: Secondary | ICD-10-CM

## 2011-10-10 DIAGNOSIS — M5412 Radiculopathy, cervical region: Secondary | ICD-10-CM

## 2011-10-10 DIAGNOSIS — S60519A Abrasion of unspecified hand, initial encounter: Secondary | ICD-10-CM

## 2011-10-10 DIAGNOSIS — S80219A Abrasion, unspecified knee, initial encounter: Secondary | ICD-10-CM

## 2011-10-10 MED ORDER — HYDROCODONE-ACETAMINOPHEN 5-325 MG PO TABS: ORAL_TABLET | ORAL | Status: DC

## 2011-10-10 MED ORDER — PREDNISONE 10 MG PO TABS: ORAL_TABLET | ORAL | Status: DC

## 2011-10-10 NOTE — Progress Notes (Signed)
  Subjective:    Patient ID: Jesse Horne, male    DOB: 1960/07/20, 51 y.o.   MRN: [id number]  HPI  Larey Seat 5 days ago. Patient tripped in his driveway. He had abrasions including left knee, right hand, right side of face. No loss of consciousness. No headaches. Since has had sharp pain lower cervical region radiating down to the hand. Moderate to severe at times. No numbness. No weakness. No prior history of cervical disc problems. He tried some Tylenol without much relief. Denies any headaches or confusion at this time.  Exacerbated by stretching out left arm. Slight relief with bringing left arm into flexion the elbow  Past Medical History  Diagnosis Date  . HYPERLIPIDEMIA 07/26/2008  . ALLERGIC RHINITIS 12/16/2008   No past surgical history on file.  reports that he quit smoking about 9 years ago. His smoking use included Cigarettes. He does not have any smokeless tobacco history on file. He reports that he drinks about one ounce of alcohol per week. He reports that he does not use illicit drugs. family history includes Cancer in his mother and Heart disease in his father. No Known Allergies   Review of Systems  Constitutional: Negative for fever, chills, appetite change and unexpected weight change.  Eyes: Negative for visual disturbance.  Respiratory: Negative for shortness of breath.   Cardiovascular: Negative for chest pain.  Neurological: Negative for dizziness, seizures, syncope, weakness, numbness and headaches.  Psychiatric/Behavioral: Negative for confusion.       Objective:   Physical Exam  Constitutional: He is oriented to person, place, and time. He appears well-developed and well-nourished.  HENT:       Healing superficial abrasions right forehead  Eyes: Pupils are equal, round, and reactive to light.  Cardiovascular: Normal rate, regular rhythm and normal heart sounds.   No murmur heard. Pulmonary/Chest: Effort normal and breath sounds normal. No respiratory  distress. He has no wheezes. He has no rales.  Musculoskeletal:       Normal range of motion left shoulder. No specific point tenderness. No rotator cuff weakness  Neurological: He is alert and oriented to person, place, and time. He has normal reflexes.       Full-strength upper extremities. Symmetric reflexes. Normal sensory function  Skin:       Superficial abrasion left knee and right hand. No signs of secondary infection          Assessment & Plan:  #1Left cervical radiculopathy symptoms following fall. Nonfocal neuro exam. Prednisone taper starting 60 mg daily. Vicodin 5/325 mg one to 2 every 6 hours when necessary for pain. Consider films possibly MRI if no improvement over the next few weeks and sooner if he has any new neurologic symptoms #2 abrasions including left knee, right face, right hand with no signs of secondary infection. No worrisome red flags for closed head injury.

## 2012-01-27 ENCOUNTER — Encounter: Payer: Self-pay | Admitting: Internal Medicine

## 2012-01-27 ENCOUNTER — Ambulatory Visit (INDEPENDENT_AMBULATORY_CARE_PROVIDER_SITE_OTHER): Payer: Private Health Insurance - Indemnity | Admitting: Internal Medicine

## 2012-01-27 VITALS — BP 160/100 | HR 76 | Resp 18 | Wt 189.0 lb

## 2012-01-27 DIAGNOSIS — R221 Localized swelling, mass and lump, neck: Secondary | ICD-10-CM

## 2012-01-27 DIAGNOSIS — T783XXA Angioneurotic edema, initial encounter: Secondary | ICD-10-CM

## 2012-01-27 DIAGNOSIS — R220 Localized swelling, mass and lump, head: Secondary | ICD-10-CM

## 2012-01-27 MED ORDER — PREDNISONE 10 MG PO TABS: ORAL_TABLET | ORAL | Status: DC

## 2012-01-27 MED ORDER — METHYLPREDNISOLONE ACETATE 80 MG/ML IJ SUSP
80.0000 mg | Freq: Once | INTRAMUSCULAR | Status: AC
  Administered 2012-01-27: 80 mg via INTRAMUSCULAR

## 2012-01-27 NOTE — Progress Notes (Signed)
Subjective:    Patient ID: Jesse Horne, male    DOB: 08-Feb-1961, 51 y.o.   MRN: [id number]  HPI  51 year old patient who presents with a chief complaint of facial swelling. 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 He has seen allergy medicine in the past without a definitive diagnosis. In the past stress and lack of sleep seem to be precipitants. One day prior to this episode he allowed his young daughter to apply some makeup to his face. Otherwise no real obvious associations. No new medications  Past Medical History  Diagnosis Date  . HYPERLIPIDEMIA 07/26/2008  . ALLERGIC RHINITIS 12/16/2008    History   Social History  . Marital Status: Married    Spouse Name: N/A    Number of Children: N/A  . Years of Education: N/A   Occupational History  . Not on file.   Social History Main Topics  . Smoking status: Former Smoker    Types: Cigarettes    Quit date: 02/25/2002  . Smokeless tobacco: Not on file  . Alcohol Use: 1.0 oz/week    2 drink(s) per week  . Drug Use: No  . Sexually Active: Not on file   Other Topics Concern  . Not on file   Social History Narrative  . No narrative on file    No past surgical history on file.  Family History  Problem Relation Age of Onset  . Cancer Mother     melanoma and basal cell  . Heart disease Father     No Known Allergies  Current Outpatient Prescriptions on File Prior to Visit  Medication Sig Dispense Refill  . HYDROcodone-acetaminophen (NORCO/VICODIN) 5-325 MG per tablet 1-2 po q 6 hours prn pain  40 tablet  0  . simvastatin (ZOCOR) 40 MG tablet Take 1 tablet (40 mg total) by mouth at bedtime.  90 tablet  6   No current facility-administered medications on file prior to visit.    BP 160/100  Pulse 76  Resp 18  Wt 189 lb (85.73 kg)      Review of Systems  Constitutional: Negative for fever, chills, appetite change and fatigue.  HENT: Negative for hearing  loss, ear pain, congestion, sore throat, trouble swallowing, neck stiffness, dental problem, voice change and tinnitus.   Eyes: Negative for pain, discharge and visual disturbance.  Respiratory: Negative for cough, chest tightness, wheezing and stridor.   Cardiovascular: Negative for chest pain, palpitations and leg swelling.  Gastrointestinal: Negative for nausea, vomiting, abdominal pain, diarrhea, constipation, blood in stool and abdominal distention.  Genitourinary: Negative for urgency, hematuria, flank pain, discharge, difficulty urinating and genital sores.  Musculoskeletal: Negative for myalgias, back pain, joint swelling, arthralgias and gait problem.  Skin: Positive for rash.  Neurological: Negative for dizziness, syncope, speech difficulty, weakness, numbness and headaches.  Hematological: Negative for adenopathy. Does not bruise/bleed easily.  Psychiatric/Behavioral: Negative for behavioral problems and dysphoric mood. The patient is not nervous/anxious.        Objective:   Physical Exam  Constitutional: He is oriented to person, place, and time. He appears well-developed.  HENT:  Head: Normocephalic.  Right Ear: External ear normal.  Left Ear: External ear normal.  Mouth/Throat: Oropharynx is clear and moist.       Oropharynx clear without tongue edema  Eyes: Conjunctivae normal and EOM are normal.  Neck: Normal range of motion.  Cardiovascular: Normal rate and normal heart sounds.   Pulmonary/Chest:  Breath sounds normal. No respiratory distress. He has no wheezes. He has no rales.  Abdominal: Bowel sounds are normal.  Musculoskeletal: Normal range of motion. He exhibits no edema and no tenderness.  Neurological: He is alert and oriented to person, place, and time.  Skin:       Marked right facial and lip edema  Psychiatric: He has a normal mood and affect. His behavior is normal.          Assessment & Plan:   Angioedema-  willl treat with depomedrol, claritin  amd ranitidine Start pred dose pack in am if no improvment

## 2012-01-27 NOTE — Patient Instructions (Addendum)
Claritin (loratadine) 1 daily  Zantac(ranitidine) one twice daily  Start prednisone in the morning if there is not dramatic clinical improvementAngioedema Angioedema (AE) is a sudden swelling of the eyelids, lips, lobes of ears, external genitalia, skin, and other parts of the body. AE can happen by itself. It usually begins during the night and is found on awakening. It can happen with hives and other allergic reactions. Attacks can be mild and annoying, or life-threatening if the air passages swell. AE generally occurs in a short time period (over minutes to hours) and gets better in 24 to 48 hours. It usually does not cause any serious problems.   There are 2 different kinds of AE:    Allergic AE.   Nonallergic AE.   There may be an overreaction or direct stimulation of cells that are a part of the immune system (mast cells).   There may be problems with the release of chemicals made by the body that cause swelling and inflammation (kinins). AE due to kinins can be inherited from parents (hereditary), or it can develop on its own (acquired). Acquired AE either shows up before, or along with, certain diseases or is due to the body's immune system attacking parts of the body's own cells (autoimmune).  CAUSES   Allergic  AE due to allergic reactions are caused by something that causes the body to react (trigger). Common triggers include:   Foods.   Medicines.   Latex.   Direct contact with certain fruits, vegetables, or animal saliva.   Insect stings.  Nonallergic  Mast cell stimulation may be caused by:   Medicines.   Dyes used in X-rays.   The body's own immune system reactions to parts of the body (autoimmune disease).   Possibly, some virus infections.   AE due to problems with kinins can be hereditary or acquired. Attacks are triggered by:   Mild injury.   Dental work or any surgery.   Stress.   Sudden changes in temperature.   Exercise.   Medicines.   AE  due to problems with kinins can also be due to certain medicines, especially blood pressure medicines like angiotensin-converting enzyme (ACE) inhibitors. African Americans are at nearly 5 times greater risk of developing AE than Caucasians from ACE inhibitors.  SYMPTOMS   Allergic symptoms:  Non-itchy swelling of the skin. Often the swelling is on the face and lips, but any area of the skin can swell. Sometimes, the swelling can be painful. If hives are present, there is intense itching.   Breathing problems if the air passages swell.  Nonallergic symptoms:  If internal organs are involved, there may be:   Nausea.   Abdominal pain.   Vomiting.   Difficulty swallowing.   Difficulty passing urine.   Breathing problems if the air passages swell.  Depending on the cause of AE, episodes may:  Only happen once (if triggers are removed or avoided).   Come back in unpredictable patterns.   Repeat for several years and then gradually fade away.  DIAGNOSIS   AE is diagnosed by:    Asking questions to find out how fast the symptoms began.   Taking a family history.   Physical exam.   Diagnostic tests. Tests could include:   Allergy skin tests to see if the problem is allergic.   Blood tests to diagnose hereditary and some acquired types of AE.   Other tests to see if there is a hidden disease leading to the AE.  TREATMENT  Treatment depends on the type and cause (if any) of the AE. Allergic  Allergic types of AE are treated with:   Immediate removal of the trigger or medicine (if any).   Epinephrine injection.   Steroids.   Antihistamines.   Hospitalization for severe attacks.  Nonallergic  Mast cell stimulation types of AE are treated with:   Immediate removal of the trigger or medicine (if any).   Epinephrine injection.   Steroids.   Antihistamines.   Hospitalization for severe attacks.   Hereditary AE is treated with:   Medicines to prevent and  treat attacks. There is little response to antihistamines, epinephrine, or steroids.   Preventive medicines before dental work or surgery.   Removing or avoiding medicines that trigger attacks.   Hospitalization for severe attacks.   Acquired AE is treated with:   Treating underlying disease (if any).   Medicines to prevent and treat attacks.  HOME CARE INSTRUCTIONS    Always carry your emergency allergy treatment medicines with you.   Wear a medical bracelet.   Avoid known triggers.  SEEK MEDICAL CARE IF:    You get repeat attacks.   Your attacks are more frequent or more severe despite preventive measures.   You have hereditary AE and are considering having children. It is important to discuss the risks of passing this on to your children.  SEEK IMMEDIATE MEDICAL CARE IF:    You have difficulty breathing.   You have difficulty swallowing.   You experience fainting.  This condition should be treated immediately. It can be life-threatening if it involves throat swelling. Document Released: 04/22/2001 Document Revised: 05/06/2011 Document Reviewed: 02/11/2008 [facility] Patient Information 2013 [place], [place].

## 2012-06-05 ENCOUNTER — Ambulatory Visit (INDEPENDENT_AMBULATORY_CARE_PROVIDER_SITE_OTHER): Payer: Managed Care, Other (non HMO) | Admitting: General Surgery

## 2012-06-05 ENCOUNTER — Encounter (INDEPENDENT_AMBULATORY_CARE_PROVIDER_SITE_OTHER): Payer: Self-pay | Admitting: General Surgery

## 2012-06-05 VITALS — BP 130/68 | HR 86 | Temp 98.2°F | Resp 18 | Ht 67.5 in | Wt 186.8 lb

## 2012-06-05 DIAGNOSIS — K649 Unspecified hemorrhoids: Secondary | ICD-10-CM

## 2012-06-05 NOTE — Progress Notes (Signed)
Subjective:     Patient ID: Jesse Horne, male   DOB: [birth year], 52 y.o.   MRN: [id number]  HPI The patient is a 52 year old male who is self-referred for bleeding hemorrhoids. The patient states he's had hemorrhoids for approximately 5-10 years. He underwent a colonoscopy in his early 67s secondary to blood per rectum with no masses found. There were no masses seen aside from hemorrhoids. The patient states he has not had the best fiber intake, or hydration. He does state he has straining with bowel movements. His bleeding per rectum does wax and wane.  Review of Systems  Constitutional: Negative.   Cardiovascular: Negative.   Gastrointestinal: Positive for blood in stool and anal bleeding. Negative for abdominal pain and abdominal distention.  Musculoskeletal: Negative.   Neurological: Negative.        Objective:   Physical Exam  Constitutional: He is oriented to person, place, and time. He appears well-developed and well-nourished.  HENT:  Head: Normocephalic and atraumatic.  Eyes: EOM are normal. Pupils are equal, round, and reactive to light.  Neck: Normal range of motion. Neck supple.  Cardiovascular: Normal rate, regular rhythm and normal heart sounds.   Pulmonary/Chest: Effort normal and breath sounds normal.  Abdominal: Soft. Bowel sounds are normal. He exhibits no distension. There is no tenderness. There is no rebound and no guarding.  Genitourinary:     Musculoskeletal: Normal range of motion.  Neurological: He is alert and oriented to person, place, and time.       Assessment:     52 year old male with an external hemorrhoid.     Plan:     1. We had a long discussion in regards to the pathophysiology of hemorrhoids. We discussed the need to increase his fiber intake and recommended Metamucil fiber to 3 times a day. In conjunction with the fiber increase his hydration will be key.  The patient was given a handout about hemorrhoids.  2.The patient is due for a  screening colonoscopy. Patient referred to Dr. Maisie Fus for screening colonoscopy.  3. The patient follow up with me or Dr. Maisie Fus for his hemorrhoids in 2 to 3 months.

## 2012-06-10 ENCOUNTER — Other Ambulatory Visit: Payer: Self-pay | Admitting: Internal Medicine

## 2012-07-02 ENCOUNTER — Encounter (INDEPENDENT_AMBULATORY_CARE_PROVIDER_SITE_OTHER): Payer: Self-pay | Admitting: General Surgery

## 2012-07-02 ENCOUNTER — Ambulatory Visit (INDEPENDENT_AMBULATORY_CARE_PROVIDER_SITE_OTHER): Payer: Managed Care, Other (non HMO) | Admitting: General Surgery

## 2012-07-02 VITALS — BP 140/82 | HR 84 | Resp 18 | Ht 68.0 in | Wt 185.0 lb

## 2012-07-02 DIAGNOSIS — K625 Hemorrhage of anus and rectum: Secondary | ICD-10-CM

## 2012-07-02 NOTE — Patient Instructions (Signed)
[facility]  ONE-DAY (1) PRE-OP HOME COLON PREP INSTRUCTIONS: ** MIRALAX / GATORADE PREP **  You must follow the instructions below carefully.  If you have questions or problems, please call and speak to someone in the clinic department at our office:   [phone].     INSTRUCTIONS: 1. Five days prior to your procedure do not eat nuts, popcorn, or fruit with seeds.  Stop all fiber supplements such as Metamucil, Citrucel, etc. 2. Two days before surgery fill the prescription at a pharmacy of your choice and purchase the additional supplies below.         MIRALAX - GATORADE -- DULCOLAX TABS:   Purchase a bottle of MIRALAX  (255 gm bottle)    In addition, purchase four (4) DULCOLAX TABLETS (no prescription required- ask the pharmacist if you can't find them)    Purchase one 64 oz GATORADE.  (Do NOT purchase red Gatorade; any other flavor is acceptable) and place in refrigerator to get cold.  3.   Day Before Surgery:   6 am: take the 4 Dulcolax tablets   You may only have clear liquids (tea, coffee, juice, broth, jello, soft drinks, gummy bears).  You cannot have solid foods, cream, milk or milk products.  Drink at lease 8 ounces of liquids every hour while awake.   Mix the entire bottle of MiraLax and the Gatorade in a large container.    10:00am: Begin drinking the Gatorade mixture until gone (8 oz every 15-30 minutes).      You may suck on a lime wedge or hard candy to "freshen your palate" in between glasses   If you are a diabetic, take your blood sugar reading several time throughout the prep.  Have some juice available to take if your sugar level gets too low   You may feel chilled while taking the prep.  Have some warm tea or broth to help warm up.   Continue clear liquids until midnight or bedtime  3. The day of your procedure:   Do not eat or drink ANYTHING after midnight before your surgery.     If you take Heart or Blood Pressure medicine, ask the pre-op nurses about  these during your preop appointment.   Further pre-operative instructions will be given to you from the hospital.   Expect to be contacted 5-7 days before your surgery.  

## 2012-07-02 NOTE — Progress Notes (Signed)
Chief Complaint  Patient presents with  . Colonoscopy    HISTORY: Covey M Lindamood is a 52 y.o. male who presents to the office with rectal bleeding.  This had been occurring for years.  He has tried OTC preparations in the past with some success.  Nothing makes the symptoms worse.   It is intermittent in nature.  His bowel habits are regular and his bowel movements are usually soft.  He does occasionally have to strain.  His fiber intake is is good.  His last colonoscopy was around 10yrs ago in [place].     Past Medical History  Diagnosis Date  . HYPERLIPIDEMIA 07/26/2008  . ALLERGIC RHINITIS 12/16/2008      No past surgical history on file.      Current Outpatient Prescriptions  Medication Sig Dispense Refill  . PROCTOSOL HC 2.5 % rectal cream apply rectally twice a day  28.35 g  3  . psyllium (METAMUCIL) 58.6 % powder Take 1 packet by mouth daily.      . simvastatin (ZOCOR) 40 MG tablet Take 1 tablet (40 mg total) by mouth at bedtime.  90 tablet  6   No current facility-administered medications for this visit.      No Known Allergies    Family History  Problem Relation Age of Onset  . Cancer Mother     Melanoma, Basal Cell Carcinoma, Breast  . Heart disease Father   . Cancer Sister     Breast  . Heart disease Sister     History   Social History  . Marital Status: Married    Spouse Name: N/A    Number of Children: N/A  . Years of Education: N/A   Social History Main Topics  . Smoking status: Former Smoker    Types: Cigarettes    Quit date: 02/25/2002  . Smokeless tobacco: Never Used  . Alcohol Use: 1.0 oz/week    2 drink(s) per week  . Drug Use: No  . Sexually Active: None   Other Topics Concern  . None   Social History Narrative  . None      REVIEW OF SYSTEMS - PERTINENT POSITIVES ONLY: Review of Systems - General ROS: negative for - chills, fever or weight loss Hematological and Lymphatic ROS: negative for - bleeding problems, blood clots or  bruising Respiratory ROS: no cough, shortness of breath, or wheezing Cardiovascular ROS: no chest pain or dyspnea on exertion Gastrointestinal ROS: no abdominal pain, change in bowel habits, or black or bloody stools Genito-Urinary ROS: no dysuria, trouble voiding, or hematuria  EXAM: Filed Vitals:   07/02/12 1337  BP: 140/82  Pulse: 84  Resp: 18    General appearance: alert and cooperative Resp: clear to auscultation bilaterally Cardio: regular rate and rhythm GI: soft, non-tender; bowel sounds normal; no masses,  no organomegaly   ASSESSMENT AND PLAN: Justyce M Blackham is a 52 y.o. M with chronic rectal bleeding.  Given that it has been around 10yrs or more since his last colonoscopy, I think he would benefit from a diagnostic exam.  I will get this scheduled for him at his convenience.  We discussed the procedure in detail.  All questions were answered.      Kush Farabee C Zeinab Rodwell, MD Colon and Rectal Surgery / General Surgery [facility]      Visit Diagnoses: 1. Rectal bleeding     Primary Care Physician: KWIATKOWSKI,PETER FRANK, MD  

## 2012-07-28 ENCOUNTER — Telehealth (INDEPENDENT_AMBULATORY_CARE_PROVIDER_SITE_OTHER): Payer: Self-pay

## 2012-07-28 NOTE — Telephone Encounter (Signed)
Patient called in stating he is scheduled for colonoscopy tomorrow but has not heard from anyone to confirm it. I told him he was scheduled at [facility] for an 11:00 procedure. I told him I wasn't aware of what time he needed to be there. He said he didn't know either and asked if he could have somemone call him with that information. I told him I will get a message to our schedulers and have them call him. Loni Muse is to call patient.

## 2012-07-29 ENCOUNTER — Encounter (HOSPITAL_COMMUNITY)
Admission: RE | Disposition: A | Discharge status: Home / Self Care | Payer: Self-pay | Source: Ambulatory Visit | Attending: General Surgery

## 2012-07-29 ENCOUNTER — Encounter (HOSPITAL_COMMUNITY): Payer: Self-pay | Admitting: *Deleted

## 2012-07-29 ENCOUNTER — Ambulatory Visit (HOSPITAL_COMMUNITY)
Admission: RE | Admit: 2012-07-29 | Discharge: 2012-07-29 | Disposition: A | Discharge status: Home / Self Care | Payer: Managed Care, Other (non HMO) | Source: Ambulatory Visit | Attending: General Surgery | Admitting: General Surgery

## 2012-07-29 DIAGNOSIS — K644 Residual hemorrhoidal skin tags: Secondary | ICD-10-CM | POA: Insufficient documentation

## 2012-07-29 DIAGNOSIS — E785 Hyperlipidemia, unspecified: Secondary | ICD-10-CM | POA: Insufficient documentation

## 2012-07-29 DIAGNOSIS — K648 Other hemorrhoids: Secondary | ICD-10-CM

## 2012-07-29 DIAGNOSIS — K625 Hemorrhage of anus and rectum: Secondary | ICD-10-CM

## 2012-07-29 DIAGNOSIS — Z79899 Other long term (current) drug therapy: Secondary | ICD-10-CM | POA: Insufficient documentation

## 2012-07-29 HISTORY — PX: COLONOSCOPY: SHX5424

## 2012-07-29 SURGERY — COLONOSCOPY
Anesthesia: Moderate Sedation

## 2012-07-29 MED ORDER — SODIUM CHLORIDE 0.9 % IV SOLN
INTRAVENOUS | Status: DC
  Administered 2012-07-29: 500 mL via INTRAVENOUS

## 2012-07-29 MED ORDER — FENTANYL CITRATE 0.05 MG/ML IJ SOLN
INTRAMUSCULAR | Status: DC | PRN
  Administered 2012-07-29 (×5): 25 ug via INTRAVENOUS

## 2012-07-29 MED ORDER — DIPHENHYDRAMINE HCL 50 MG/ML IJ SOLN
INTRAMUSCULAR | Status: AC
  Filled 2012-07-29: qty 1

## 2012-07-29 MED ORDER — FENTANYL CITRATE 0.05 MG/ML IJ SOLN
INTRAMUSCULAR | Status: AC
  Filled 2012-07-29: qty 4

## 2012-07-29 MED ORDER — MIDAZOLAM HCL 10 MG/2ML IJ SOLN
INTRAMUSCULAR | Status: DC | PRN
  Administered 2012-07-29: 1 mg via INTRAVENOUS
  Administered 2012-07-29 (×3): 2 mg via INTRAVENOUS
  Administered 2012-07-29: 1 mg via INTRAVENOUS

## 2012-07-29 MED ORDER — MIDAZOLAM HCL 10 MG/2ML IJ SOLN
INTRAMUSCULAR | Status: AC
  Filled 2012-07-29: qty 4

## 2012-07-29 NOTE — Op Note (Addendum)
[facility] [address] [place], [address]   COLONOSCOPY PROCEDURE REPORT  PATIENT: Jesse Horne, Jesse Horne  MR#: [id number] BIRTHDATE: 1960-07-02 , 51  yrs. old GENDER: Male ENDOSCOPIST: Vanita Panda, MD REFERRED BY: PROCEDURE DATE:  07/29/2012 PROCEDURE:   Colonoscopy, diagnostic ASA CLASS:   Class II INDICATIONS:Rectal Bleeding. MEDICATIONS: Fentanyl 125 mcg IV and Versed 8 mg IV  DESCRIPTION OF PROCEDURE:   After the risks benefits and alternatives of the procedure were thoroughly explained, informed consent was obtained.  A digital rectal exam revealed external hemorrhoids.   The Pentax Colonoscope [id number]  endoscope was introduced through the anus and advanced to the cecum, which was identified by both the appendix and ileocecal valve. No adverse events experienced.   The quality of the prep was adequate, using MiraLax  The instrument was then slowly withdrawn as the colon was fully examined.    Findings: inflamed internal and external hemorrhoids   COLON FINDINGS: A normal appearing cecum, ileocecal valve, and appendiceal orifice were identified.  The ascending, hepatic flexure, transverse, splenic flexure, descending, sigmoid colon and rectum appeared unremarkable.  No polyps or cancers were seen. Retroflexed views revealed internal hemorrhoids.  Long, redundant colon.  Withdrawal time was 27 minutes.  The scope was withdrawn and the procedure completed.  ENDOSCOPIC IMPRESSION:     1.   Normal colon 2.   External hemorrhoids 3.   Internal hemorrhoids   RECOMMENDATIONS:     1.  Call to schedule a follow-up appointment with Routine colonoscopy for screening in 10 years 2.  High fiber diet with liberal fluid intake.  Continue fiber supplement. 3.  Call office for follow-up appointment in 3-4 weeks to further discuss your rectal bleeding and hemorrhoid disease.   eSigned:  Vanita Panda, MD 07/29/2012 11:25 AM Revised: 07/29/2012 11:25  AM  cc: Gordy Savers, MD

## 2012-07-29 NOTE — Interval H&P Note (Signed)
History and Physical Interval Note:  07/29/2012 9:56 AM  Jesse Horne  has presented today for colonoscopy, with the diagnosis of rectal bleeding  The various methods of treatment have been discussed with the patient and family. After consideration of risks, benefits and other options for treatment, the patient has consented to  Procedure(s) with comments: COLONOSCOPY (N/A) - moved to 11:00 am per Azucena Kuba will notify the patient. PF as a surgical intervention .  The patient's history has been reviewed, patient examined, no change in status, stable for surgery.  I have reviewed the patient's chart and labs.  Questions were answered to the patient's satisfaction.     Vanita Panda, MD  Colorectal and General Surgery [facility]

## 2012-07-29 NOTE — H&P (View-Only) (Signed)
Chief Complaint  Patient presents with  . Colonoscopy    HISTORY: Jesse Horne is a 52 y.o. male who presents to the office with rectal bleeding.  This had been occurring for years.  He has tried OTC preparations in the past with some success.  Nothing makes the symptoms worse.   It is intermittent in nature.  His bowel habits are regular and his bowel movements are usually soft.  He does occasionally have to strain.  His fiber intake is is good.  His last colonoscopy was around 26yrs ago in [place].     Past Medical History  Diagnosis Date  . HYPERLIPIDEMIA 07/26/2008  . ALLERGIC RHINITIS 12/16/2008      No past surgical history on file.      Current Outpatient Prescriptions  Medication Sig Dispense Refill  . PROCTOSOL HC 2.5 % rectal cream apply rectally twice a day  28.35 g  3  . psyllium (METAMUCIL) 58.6 % powder Take 1 packet by mouth daily.      . simvastatin (ZOCOR) 40 MG tablet Take 1 tablet (40 mg total) by mouth at bedtime.  90 tablet  6   No current facility-administered medications for this visit.      No Known Allergies    Family History  Problem Relation Age of Onset  . Cancer Mother     Melanoma, Basal Cell Carcinoma, Breast  . Heart disease Father   . Cancer Sister     Breast  . Heart disease Sister     History   Social History  . Marital Status: Married    Spouse Name: N/A    Number of Children: N/A  . Years of Education: N/A   Social History Main Topics  . Smoking status: Former Smoker    Types: Cigarettes    Quit date: 02/25/2002  . Smokeless tobacco: Never Used  . Alcohol Use: 1.0 oz/week    2 drink(s) per week  . Drug Use: No  . Sexually Active: None   Other Topics Concern  . None   Social History Narrative  . None      REVIEW OF SYSTEMS - PERTINENT POSITIVES ONLY: Review of Systems - General ROS: negative for - chills, fever or weight loss Hematological and Lymphatic ROS: negative for - bleeding problems, blood clots or  bruising Respiratory ROS: no cough, shortness of breath, or wheezing Cardiovascular ROS: no chest pain or dyspnea on exertion Gastrointestinal ROS: no abdominal pain, change in bowel habits, or black or bloody stools Genito-Urinary ROS: no dysuria, trouble voiding, or hematuria  EXAM: Filed Vitals:   07/02/12 1337  BP: 140/82  Pulse: 84  Resp: 18    General appearance: alert and cooperative Resp: clear to auscultation bilaterally Cardio: regular rate and rhythm GI: soft, non-tender; bowel sounds normal; no masses,  no organomegaly   ASSESSMENT AND PLAN: Jesse Horne is a 52 y.o. M with chronic rectal bleeding.  Given that it has been around 58yrs or more since his last colonoscopy, I think he would benefit from a diagnostic exam.  I will get this scheduled for him at his convenience.  We discussed the procedure in detail.  All questions were answered.      Vanita Panda, MD Colon and Rectal Surgery / General Surgery [facility]      Visit Diagnoses: 1. Rectal bleeding     Primary Care Physician: Rogelia Boga, MD

## 2012-08-03 ENCOUNTER — Encounter (HOSPITAL_COMMUNITY): Payer: Self-pay | Admitting: General Surgery

## 2012-08-13 ENCOUNTER — Encounter (INDEPENDENT_AMBULATORY_CARE_PROVIDER_SITE_OTHER): Payer: Self-pay | Admitting: General Surgery

## 2012-08-13 ENCOUNTER — Ambulatory Visit (INDEPENDENT_AMBULATORY_CARE_PROVIDER_SITE_OTHER): Payer: Managed Care, Other (non HMO) | Admitting: General Surgery

## 2012-08-13 VITALS — BP 140/82 | HR 78 | Temp 98.1°F | Resp 18 | Ht 68.0 in | Wt 186.0 lb

## 2012-08-13 DIAGNOSIS — K6289 Other specified diseases of anus and rectum: Secondary | ICD-10-CM

## 2012-08-13 MED ORDER — LIDOCAINE 5 % EX OINT: TOPICAL_OINTMENT | CUTANEOUS | Status: DC | PRN

## 2012-08-13 NOTE — Progress Notes (Signed)
Jesse Horne is a 52 y.o. male who is here for a follow up visit regarding his anal pain and bleeding.  His main symptom right now is pain.  He does strain to have BM's.  He has bleeding 1-2 times a week.  He is currently using proctosol externally.   Objective: Filed Vitals:   08/13/12 1405  BP: 140/82  Pulse: 78  Temp: 98.1 F (36.7 C)  Resp: 18    General appearance: alert and cooperative  Procedure: Anoscopy Surgeon: Jesse Horne Assistant: Christella Scheuermann After the risks and benefits were explained, verbal consent was obtained for above procedure  Anesthesia: none Diagnosis: anal pain Findings: grade 2 large R post hemorrhoid, significant pain on exam, post skin tag (evidence of previous fissure)   Assessment and Plan: Continue fiber supplement.  Add in Miralax.  Main symptom is pain.  Will prescribe some lidocaine ointment and have him start Sitz baths.  RTO in 4 weeks    .Vanita Panda, MD [facility], [place] [phone]

## 2012-08-13 NOTE — Patient Instructions (Signed)
Start Miralax daily.  Use warm baths or showers 2-3 times a day to help relax your sphincter muscles.  Use lidocaine as needed for pain.  Remember to drink plenty of fluids.

## 2012-09-08 ENCOUNTER — Ambulatory Visit (INDEPENDENT_AMBULATORY_CARE_PROVIDER_SITE_OTHER): Payer: Managed Care, Other (non HMO) | Admitting: Internal Medicine

## 2012-09-08 ENCOUNTER — Encounter: Payer: Self-pay | Admitting: Internal Medicine

## 2012-09-08 VITALS — BP 136/82 | HR 79 | Temp 98.5°F | Wt 188.0 lb

## 2012-09-08 DIAGNOSIS — J069 Acute upper respiratory infection, unspecified: Secondary | ICD-10-CM

## 2012-09-08 DIAGNOSIS — J309 Allergic rhinitis, unspecified: Secondary | ICD-10-CM

## 2012-09-08 MED ORDER — HYDROCODONE-HOMATROPINE 5-1.5 MG/5ML PO SYRP: 5.0000 mL | ORAL_SOLUTION | ORAL | Status: DC | PRN

## 2012-09-08 NOTE — Patient Instructions (Addendum)
I think this is a viral respiratory infection like a chest cold and the cough could last another one to 2 weeks. However if you're getting high fevers active wheezing or worsening contact us for reevaluation.  I think there is underlying allergy in your upper respiratory tract I would like you to restart your nasal cortisone such as Nasonex and take it every day to decrease the inflammation in your sinuses.  This should also help you get better quicker.  You can use a cough medication at night if needed for comfort sometimes if you can sleepy feel better the next day.  Asks the allergy office if it's okay to continue with your allergy shots on schedule as you don't have a fever pneumonia or asthmatic flare.

## 2012-09-08 NOTE — Progress Notes (Signed)
Chief Complaint  Patient presents with  . Cough    Ongoing for 1 week.  Has had some yellow sputum.  Cough is making throat sore.  Has been getting allergy injections.    HPI: Patient comes in today for SDA for  new problem evaluation. Patient is under therapy for allergy injections mostly tree pollen is on increasing doses. Insidious onset for about a week of dry hacking cough with some malaise that is now a bit more loose without associated shortness of breath or wheezing. Early on he did have some diaphragmatic discomfort. He used his wife's cough medicine helped a bit but made him tired the next day. He has upper respiratory allergies and is not taking his Nasonex although was prescribed period  Has no tobacco or ETS hemoptysis fever chills or current chest pain. ROS: See pertinent positives and negatives per HPI.  Past Medical History  Diagnosis Date  . HYPERLIPIDEMIA 07/26/2008  . ALLERGIC RHINITIS 12/16/2008    Family History  Problem Relation Age of Onset  . Cancer Mother     Melanoma, Basal Cell Carcinoma, Breast  . Heart disease Father   . Cancer Sister     Breast  . Heart disease Sister     History   Social History  . Marital Status: Married    Spouse Name: N/A    Number of Children: N/A  . Years of Education: N/A   Social History Main Topics  . Smoking status: Former Smoker    Types: Cigarettes    Quit date: 02/25/2002  . Smokeless tobacco: Never Used  . Alcohol Use: 1.0 oz/week    2 drink(s) per week  . Drug Use: No  . Sexually Active: None   Other Topics Concern  . None   Social History Narrative  . None    Outpatient Encounter Prescriptions as of 09/08/2012  Medication Sig Dispense Refill  . EPIPEN 2-PAK 0.3 MG/0.3ML SOAJ       . lidocaine (XYLOCAINE) 5 % ointment Apply topically as needed.  35.44 g  0  . PROCTOSOL HC 2.5 % rectal cream apply rectally twice a day  28.35 g  3  . psyllium (REGULOID) 0.52 G capsule Take 0.52 g by mouth daily.       . simvastatin (ZOCOR) 40 MG tablet Take 1 tablet (40 mg total) by mouth at bedtime.  90 tablet  6  . HYDROcodone-homatropine (HYCODAN) 5-1.5 MG/5ML syrup Take 5 mLs by mouth every 4 (four) hours as needed for cough.  180 mL  0   No facility-administered encounter medications on file as of 09/08/2012.    EXAM:  BP 136/82  Pulse 79  Temp(Src) 98.5 F (36.9 C) (Oral)  Wt 188 lb (85.276 kg)  BMI 28.59 kg/m2  SpO2 98%  Body mass index is 28.59 kg/(m^2).  GENERAL: vitals reviewed and listed above, alert, oriented, appears well hydrated and in no acute distress mild upper respiratory congestion  HEENT: atraumatic, conjunctiva  clear, no obvious abnormalities on inspection of external nose and ears TMs are clear nares +2 congested face nontender OP : no lesion edema or exudate   NECK: no obvious masses on inspection palpation no adenopathy LUNGS: clear to auscultation bilaterally, no wheezes, rales or rhonchi, good air movement CV: HRRR, no clubbing cyanosis or  peripheral edema nl cap refill  MS: moves all extremities without noticeable focal  abnormality PSYCH: pleasant and cooperative, no obvious depression or anxiety  ASSESSMENT AND PLAN:  Discussed the following assessment  and plan:  Viral upper respiratory tract infection with cough - Could be aggravated by underlying allergy at this time exam is reassuring to know alarm findings. Expectant management symptom treatment discuss with allergist   Allergic rhinitis - Restart his nasal cortisone it can only help  -Patient advised to return or notify health care team  if symptoms worsen or persist or new concerns arise.  Patient Instructions  I think this is a viral respiratory infection like a chest cold and the cough could last another one to 2 weeks. However if you're getting high fevers active wheezing or worsening contact us for reevaluation.  I think there is underlying allergy in your upper respiratory tract I would like you  to restart your nasal cortisone such as Nasonex and take it every day to decrease the inflammation in your sinuses.  This should also help you get better quicker.  You can use a cough medication at night if needed for comfort sometimes if you can sleepy feel better the next day.  Asks the allergy office if it's okay to continue with your allergy shots on schedule as you don't have a fever pneumonia or asthmatic flare.   Neta Mends. Coti Burd M.D.

## 2012-09-10 ENCOUNTER — Encounter (INDEPENDENT_AMBULATORY_CARE_PROVIDER_SITE_OTHER): Payer: Managed Care, Other (non HMO) | Admitting: General Surgery

## 2012-09-25 ENCOUNTER — Other Ambulatory Visit: Payer: Self-pay | Admitting: Internal Medicine

## 2012-10-14 ENCOUNTER — Telehealth (INDEPENDENT_AMBULATORY_CARE_PROVIDER_SITE_OTHER): Payer: Self-pay

## 2012-10-14 NOTE — Telephone Encounter (Signed)
I left a message for the pt to call.  We are refilling his Lidocaine ointment 5% #30 for 10 days supply at [facility].  Order faxed [phone].  He needs to schedule a follow up with Dr Maisie Fus though.  Please schedule 1st available.

## 2012-11-13 ENCOUNTER — Encounter (INDEPENDENT_AMBULATORY_CARE_PROVIDER_SITE_OTHER): Payer: Managed Care, Other (non HMO) | Admitting: General Surgery

## 2012-12-03 ENCOUNTER — Other Ambulatory Visit (INDEPENDENT_AMBULATORY_CARE_PROVIDER_SITE_OTHER): Payer: Managed Care, Other (non HMO)

## 2012-12-03 DIAGNOSIS — E785 Hyperlipidemia, unspecified: Secondary | ICD-10-CM

## 2012-12-03 DIAGNOSIS — Z0000 Encounter for general adult medical examination without abnormal findings: Secondary | ICD-10-CM

## 2012-12-03 LAB — POCT URINALYSIS DIPSTICK
Blood, UA: NEGATIVE
Glucose, UA: NEGATIVE
Nitrite, UA: NEGATIVE
Protein, UA: NEGATIVE
Spec Grav, UA: 1.025
Urobilinogen, UA: 0.2
pH, UA: 5.5

## 2012-12-03 LAB — BASIC METABOLIC PANEL
BUN: 17 mg/dL (ref 6–23)
Calcium: 9.4 mg/dL (ref 8.4–10.5)
GFR: 83.31 mL/min (ref >60.00)
Glucose, Bld: 97 mg/dL (ref 70–99)
Potassium: 4.2 mEq/L (ref 3.5–5.1)

## 2012-12-03 LAB — TSH: TSH: 1.55 u[IU]/mL (ref 0.35–5.50)

## 2012-12-03 LAB — CBC WITH DIFFERENTIAL/PLATELET
Basophils Absolute: 0 10*3/uL (ref 0.0–0.1)
Eosinophils Relative: 2.1 % (ref 0.0–5.0)
HCT: 40.8 % (ref 39.0–52.0)
Lymphocytes Relative: 24.8 % (ref 12.0–46.0)
Monocytes Relative: 9.5 % (ref 3.0–12.0)
Neutrophils Relative %: 63.2 % (ref 43.0–77.0)
Platelets: 302 10*3/uL (ref 150.0–400.0)
RDW: 13.3 % (ref 11.5–14.6)
WBC: 6.7 10*3/uL (ref 4.5–10.5)

## 2012-12-03 LAB — LIPID PANEL
Cholesterol: 233 mg/dL — ABNORMAL HIGH (ref 0–200)
HDL: 53.6 mg/dL (ref >39.00)
Total CHOL/HDL Ratio: 4
Triglycerides: 221 mg/dL — ABNORMAL HIGH (ref 0.0–149.0)
VLDL: 44.2 mg/dL — ABNORMAL HIGH (ref 0.0–40.0)

## 2012-12-03 LAB — LDL CHOLESTEROL, DIRECT: Direct LDL: 141.8 mg/dL

## 2012-12-03 LAB — HEPATIC FUNCTION PANEL
ALT: 58 U/L — ABNORMAL HIGH (ref 0–53)
AST: 33 U/L (ref 0–37)
Albumin: 4.2 g/dL (ref 3.5–5.2)
Alkaline Phosphatase: 52 U/L (ref 39–117)

## 2012-12-10 ENCOUNTER — Ambulatory Visit (INDEPENDENT_AMBULATORY_CARE_PROVIDER_SITE_OTHER): Payer: Managed Care, Other (non HMO) | Admitting: Internal Medicine

## 2012-12-10 ENCOUNTER — Encounter: Payer: Self-pay | Admitting: Internal Medicine

## 2012-12-10 VITALS — BP 140/90 | HR 86 | Temp 98.2°F | Resp 20 | Ht 67.0 in | Wt 192.0 lb

## 2012-12-10 DIAGNOSIS — K649 Unspecified hemorrhoids: Secondary | ICD-10-CM

## 2012-12-10 DIAGNOSIS — J309 Allergic rhinitis, unspecified: Secondary | ICD-10-CM

## 2012-12-10 DIAGNOSIS — Z23 Encounter for immunization: Secondary | ICD-10-CM

## 2012-12-10 DIAGNOSIS — E785 Hyperlipidemia, unspecified: Secondary | ICD-10-CM

## 2012-12-10 DIAGNOSIS — Z0000 Encounter for general adult medical examination without abnormal findings: Secondary | ICD-10-CM

## 2012-12-10 MED ORDER — ATORVASTATIN CALCIUM 80 MG PO TABS: 80.0000 mg | ORAL_TABLET | Freq: Every day | ORAL | Status: DC

## 2012-12-10 NOTE — Patient Instructions (Signed)
Limit your sodium (Salt) intake    It is important that you exercise regularly, at least 20 minutes 3 to 4 times per week.  If you develop chest pain or shortness of breath seek  medical attention.  You need to lose weight.  Consider a lower calorie diet and regular exercise.  Return in one year for follow-up 

## 2012-12-10 NOTE — Progress Notes (Signed)
Subjective:    Patient ID: Jesse Horne, male    DOB: [birth year], 52 y.o.   MRN: [id number]  HPI  52 year old patient who is seen today for a health maintenance exam. He has been seen by general surgery recently due to symptomatic hemorrhoids and did have a colonoscopy performed. These remained intermittently symptomatic and he is scheduled for general surgical followup He is now followed by allergy medicine due to allergic rhinitis. 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 He has resumed immunotherapy over the past few months He has dyslipidemia and has been on simvastatin 40. Over the past few years total cholesterol has steadily increased   Allergies (verified):  No Known Drug Allergies   Past History:  Past Medical History:   1. ETT, January 2010  --13 minutes n Bruce. Negative ECG  --ECHO: EF 60%  2. 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 Hyperlipidemia  Allergic rhinitis  positional vertigo   Past Surgical History:  coclonoscopy age [age]  Family History:   Father is alive, had a heart attack in his 29s. Mother recently died of complications of heart failure  He has 2 sisters. There is no family history of  premature coronary artery disease or sudden cardiac death.   mother- History melanoma, as well as basal cell skin cancer; mother and a sister, status post lumpectomy for breast cancer   Social History:   He is married. He has 3 kids. 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 He is a [employer]. He previously  smoked, but quit in 2005. He drinks 2 cups of large coffee a day. He  does some exercise as described above.     Review of Systems  Constitutional: Negative for fever, chills, activity change, appetite change and fatigue.  HENT: Negative for congestion, dental problem, ear pain, hearing loss, mouth sores, rhinorrhea, sinus pressure, sneezing, tinnitus, trouble swallowing and voice change.   Eyes: Negative for photophobia, pain, redness and visual  disturbance.  Respiratory: Negative for apnea, cough, choking, chest tightness, shortness of breath and wheezing.   Cardiovascular: Negative for chest pain, palpitations and leg swelling.  Gastrointestinal: Negative for nausea, vomiting, abdominal pain, diarrhea, constipation, blood in stool, abdominal distention, anal bleeding and rectal pain.  Genitourinary: Negative for dysuria, urgency, frequency, hematuria, flank pain, decreased urine volume, discharge, penile swelling, scrotal swelling, difficulty urinating, genital sores and testicular pain.  Musculoskeletal: Negative for arthralgias, back pain, gait problem, joint swelling, myalgias, neck pain and neck stiffness.  Skin: Negative for color change, rash and wound.  Neurological: Negative for dizziness, tremors, seizures, syncope, facial asymmetry, speech difficulty, weakness, light-headedness, numbness and headaches.  Hematological: Negative for adenopathy. Does not bruise/bleed easily.  Psychiatric/Behavioral: Negative for suicidal ideas, hallucinations, behavioral problems, confusion, sleep disturbance, self-injury, dysphoric mood, decreased concentration and agitation. The patient is not nervous/anxious.        Objective:   Physical Exam  Constitutional: He appears well-developed and well-nourished.  HENT:  Head: Normocephalic and atraumatic.  Right Ear: External ear normal.  Left Ear: External ear normal.  Nose: Nose normal.  Mouth/Throat: Oropharynx is clear and moist.  Eyes: Conjunctivae and EOM are normal. Pupils are equal, round, and reactive to light. No scleral icterus.  Neck: Normal range of motion. Neck supple. No JVD present. No thyromegaly present.  Cardiovascular: Regular rhythm, normal heart sounds and intact distal pulses.  Exam reveals no gallop and no friction rub.   No murmur heard. Pulmonary/Chest: Effort normal and breath sounds  normal. He exhibits no tenderness.  Abdominal: Soft. Bowel sounds are normal. He  exhibits no distension and no mass. There is no tenderness.  Genitourinary: Prostate normal and penis normal.  Musculoskeletal: Normal range of motion. He exhibits no edema and no tenderness.  Lymphadenopathy:    He has no cervical adenopathy.  Neurological: He is alert. He has normal reflexes. No cranial nerve deficit. Coordination normal.  Skin: Skin is warm and dry. No rash noted.  Psychiatric: He has a normal mood and affect. His behavior is normal.          Assessment & Plan:   Preventive health examination Dyslipidemia. We'll change to atorvastatin 80 Internal hemorrhoids. Follow general surgery Allergic rhinitis/history of angioedema. Followup allergy medicine  Recheck one year

## 2012-12-15 ENCOUNTER — Encounter (INDEPENDENT_AMBULATORY_CARE_PROVIDER_SITE_OTHER): Payer: Self-pay | Admitting: General Surgery

## 2012-12-15 ENCOUNTER — Ambulatory Visit (INDEPENDENT_AMBULATORY_CARE_PROVIDER_SITE_OTHER): Payer: Managed Care, Other (non HMO) | Admitting: General Surgery

## 2012-12-15 VITALS — BP 130/78 | HR 70 | Temp 97.6°F | Resp 16 | Ht 67.5 in | Wt 190.0 lb

## 2012-12-15 DIAGNOSIS — K602 Anal fissure, unspecified: Secondary | ICD-10-CM

## 2012-12-15 NOTE — Progress Notes (Signed)
Jesse Horne is a 52 y.o. male who is here for a follow up visit regarding his constipation and anal.  His main symptom is anal pain.  He denies any major bleeding.  Takes 6 metamucil pills and 1 capful of miralax.  Has good days and bad days.  No real improvement.  Objective: Filed Vitals:   12/15/12 1155  BP: 130/78  Pulse: 70  Temp: 97.6 F (36.4 C)  Resp: 16    General appearance: alert and cooperative GI: normal findings: soft, non-tender Anal: small skin tag, sphincter hypertension, small posterior fissure  Assessment and Plan: Jesse Horne is here for anal pain.  He is using fiber and miralax but is not getting much relief.  I am going to treat his fissure with diltiazem ointment.  I will see him back in 2 months.  If he shows no signs of improvement by then, he may need to undergo an EUA.      Marland KitchenVanita Panda, MD [facility], [place] [phone]

## 2012-12-15 NOTE — Patient Instructions (Signed)

## 2013-01-12 ENCOUNTER — Encounter: Payer: Managed Care, Other (non HMO) | Admitting: Internal Medicine

## 2013-02-16 ENCOUNTER — Ambulatory Visit (INDEPENDENT_AMBULATORY_CARE_PROVIDER_SITE_OTHER): Payer: Managed Care, Other (non HMO) | Admitting: General Surgery

## 2013-02-16 ENCOUNTER — Encounter (INDEPENDENT_AMBULATORY_CARE_PROVIDER_SITE_OTHER): Payer: Self-pay | Admitting: General Surgery

## 2013-02-16 VITALS — BP 120/88 | HR 76 | Temp 98.9°F | Resp 14 | Ht 67.5 in | Wt 188.2 lb

## 2013-02-16 DIAGNOSIS — K6289 Other specified diseases of anus and rectum: Secondary | ICD-10-CM

## 2013-02-16 NOTE — Progress Notes (Signed)
Jesse Horne is a 52 y.o. male who is here for a follow up visit regarding his anal pain.  He feels that his fissure has healed.  He denies any new problems.  He has very occasional bleeding.    Objective: Filed Vitals:   02/16/13 1156  BP: 120/88  Pulse: 76  Temp: 98.9 F (37.2 C)  Resp: 14    General appearance: alert and cooperative GI: normal findings: soft, non-tender Fissure healed, moderate sized skin tag  Assessment and Plan: Seems to be doing well.  Fissure healed with diltiazem.  Can stop this for now.  Titrate miralax back slowly.  Cont fiber indefinitely.  RTO PRN    .Vanita Panda, MD [facility], [place] [phone]

## 2013-02-16 NOTE — Patient Instructions (Signed)
Slowly wean off the Miralax.  Continue the fiber supplement.  Call the office if you have any new problems

## 2013-04-19 ENCOUNTER — Telehealth: Payer: Self-pay | Admitting: *Deleted

## 2013-04-19 NOTE — Telephone Encounter (Signed)
Spoke to pt told him Wellness forms he dropped off will be ready tomorrow for pickup. Pt verbalized understanding.

## 2013-09-21 ENCOUNTER — Telehealth (INDEPENDENT_AMBULATORY_CARE_PROVIDER_SITE_OTHER): Payer: Self-pay | Admitting: General Surgery

## 2013-09-21 NOTE — Telephone Encounter (Signed)
Patient called back and explained that he is still having some bleeding.  Denies pain, constipation, loose stools, etc.  He believes it is the skin tag that he has had and he would like to have it removed in the office.  Informed him that I could set up an appt for him to see Dr. Maisie Fushomas for a re-evaluation.  Informed him that in order for us to remove it in the office, Dr. Maisie Fushomas would have to look at it and determine if it is doable in the office. appt was made w/ Dr. Maisie Fushomas on 8/6 and pt agreed to this.

## 2013-09-21 NOTE — Telephone Encounter (Signed)
Message copied by Ignacia MarvelMOFFITT, KENDALL on Tue Sep 21, 2013  4:31 PM ------      Message from: Maryan PulsMOORE, CHRISTY      Created: Tue Sep 21, 2013  4:12 PM      Regarding: please call       Please call patient he ask to speak with Dr. Maurine Ministerhomas's nurse.  Offered to help patient but, preferred to speak with you.            Christy       ------

## 2013-09-21 NOTE — Telephone Encounter (Signed)
LMOM asking pt to return my call in regards to him wanting to speak with me.

## 2013-09-29 ENCOUNTER — Ambulatory Visit (INDEPENDENT_AMBULATORY_CARE_PROVIDER_SITE_OTHER): Payer: Managed Care, Other (non HMO) | Admitting: General Surgery

## 2013-09-29 ENCOUNTER — Encounter (INDEPENDENT_AMBULATORY_CARE_PROVIDER_SITE_OTHER): Payer: Self-pay | Admitting: General Surgery

## 2013-09-29 VITALS — BP 122/80 | HR 86 | Temp 97.8°F | Ht 67.0 in | Wt 175.0 lb

## 2013-09-29 DIAGNOSIS — K648 Other hemorrhoids: Secondary | ICD-10-CM

## 2013-09-29 NOTE — Progress Notes (Signed)
Ranae PlumberDavid Stanko is a 53 y.o. male who is here for a follow up visit regarding his rectal bleeding.  He reports blood on the toilet paper when he wipes.  He is s/p a normal colonoscopy last year and a fissure last Dec.  He is having regular BM's and takes metamucil caps for fiber intake.    Objective: Filed Vitals:   09/29/13 1445  BP: 122/80  Pulse: 86  Temp: 97.8 F (36.6 C)   Gen: NAD GI: normal findings: soft, non-tender  Procedure: Anoscopy Surgeon: Maisie Fushomas Assistant: Suzie PortelaMoffitt After the risks and benefits were explained, verbal consent was obtained for above procedure  Anesthesia: none Diagnosis: anal bleeding Findings: R posterior grade 2 inflamed and bleeding internal hemorrhoid, inflamed grade 2 L lateral internal hemorrhoid   Assessment and Plan: Ranae PlumberDavid Olden is a 53 y.o. M with rectal bleeding and inflamed internal hemorrhoids.  I think he would be a good candidate for banding in the office.  He would like to schedule this next week.      Vanita PandaAlicia C Alechia Lezama, MD [facility], [place] [phone]

## 2013-09-29 NOTE — Patient Instructions (Signed)

## 2013-10-04 ENCOUNTER — Ambulatory Visit (INDEPENDENT_AMBULATORY_CARE_PROVIDER_SITE_OTHER): Payer: Managed Care, Other (non HMO) | Admitting: General Surgery

## 2013-10-04 ENCOUNTER — Encounter (INDEPENDENT_AMBULATORY_CARE_PROVIDER_SITE_OTHER): Payer: Self-pay | Admitting: General Surgery

## 2013-10-04 VITALS — BP 126/76 | HR 60 | Temp 98.0°F | Resp 18 | Ht 67.5 in | Wt 179.0 lb

## 2013-10-04 DIAGNOSIS — K648 Other hemorrhoids: Secondary | ICD-10-CM

## 2013-10-04 NOTE — Progress Notes (Signed)
Jesse PlumberDavid Cullom is a 53 y.o. male who is here for a follow up visit regarding his rectal bleeding. He reports blood on the toilet paper when he wipes. He is s/p a normal colonoscopy last year and a fissure last Dec. He is having regular BM's and takes metamucil caps for fiber intake.  He was seen last week and determined to be a good candidate for banding.    Objective:  BP 126/76  Pulse 60  Temp(Src) 98 F (36.7 C)  Resp 18  Ht 5' 7.5" (1.715 m)  Wt 179 lb (81.194 kg)  BMI 27.61 kg/m2 Gen: NAD  GI: normal findings: soft, non-tender   The anatomy & physiology of the anorectal region was discussed.  The pathophysiology of hemorrhoids and differential diagnosis was discussed.  Natural history progression  was discussed.   I stressed the importance of a bowel regimen to have daily soft bowel movements to minimize progression of disease.     The patient's symptoms are not adequately controlled.  Therefore, I recommended banding to treat the hemorrhoids.  I went over the technique, risks, benefits, and alternatives.   Goals of post-operative recovery were discussed as well.  Questions were answered.  The patient expressed understanding & wished to proceed.  The patient was positioned in the prone position on the proctology table.  Perianal & rectal examination was done.  Using anoscopy, I ligated the RP and LL hemorrhoids above the dentate line with banding.  The patient tolerated the procedure well.  Educational handouts further explaining the pathology, treatment options, and bowel regimen were given as well.   Assessment and Plan:  Jesse Horne is a 53 y.o. M with rectal bleeding and inflamed internal hemorrhoids.  I have banded his hemorrhoids in the office.  I will see him back in 4 weeks.     Vanita PandaAlicia C Graydon Fofana, MD  [facility], [place]  [phone]

## 2013-10-04 NOTE — Patient Instructions (Signed)
Patient Information following hemorrhoid banding  Hemorrhoid banding is a procedure that places a small rubber band around a hemorrhoid, causing it to clot and then break off and be passed in the stool.  This is a minor procedure, but problems may develop in rare cases.  The following are warning symptoms and signs that should alert you to a possible complication.  Please contact the office if any of these should occur:   Increased pain with bowel movements or sitting  Temperature over 100.4 F (oral)  Bleeding that is excessive (over one cup of clots or blood)  Redness or irritation outside the anus  Difficulty urinating  For your comfort please follow these instructions:   Maintain a high fiber diet so that your bowel movements will be soft  Take a fiber supplement twice a day (such as Metamucil, Benefiber, Citracel or Fibercon)  Sit in a tub of warm water 2-3 times a day for the first 2-3 days, as needed to soothe the area.  You may expect some pressure sensations in the anal area for 1-2 days  If you need pain medication, take Tylenol not aspirin or Ibuprofen products.  In 7-10 days, the banded tissue and rubber band will pass with your stool.  Occasionally there is some bleeding after this.  If this bleeding seems excessive, call the office immediately or go to the Emergency Room.    Make an appointment to see me in 2-3 weeks after the procedure 

## 2013-11-02 ENCOUNTER — Other Ambulatory Visit (INDEPENDENT_AMBULATORY_CARE_PROVIDER_SITE_OTHER): Payer: Self-pay | Admitting: General Surgery

## 2013-11-02 ENCOUNTER — Encounter (INDEPENDENT_AMBULATORY_CARE_PROVIDER_SITE_OTHER): Payer: Managed Care, Other (non HMO) | Admitting: General Surgery

## 2013-11-25 ENCOUNTER — Other Ambulatory Visit (INDEPENDENT_AMBULATORY_CARE_PROVIDER_SITE_OTHER): Payer: Managed Care, Other (non HMO)

## 2013-11-25 DIAGNOSIS — Z0000 Encounter for general adult medical examination without abnormal findings: Secondary | ICD-10-CM

## 2013-11-25 LAB — POCT URINALYSIS DIPSTICK
Bilirubin, UA: NEGATIVE
Blood, UA: NEGATIVE
GLUCOSE UA: NEGATIVE
Ketones, UA: NEGATIVE
Leukocytes, UA: NEGATIVE
Nitrite, UA: NEGATIVE
Protein, UA: NEGATIVE
SPEC GRAV UA: 1.015
Urobilinogen, UA: 0.2
pH, UA: 5.5

## 2013-11-25 LAB — BASIC METABOLIC PANEL
BUN: 16 mg/dL (ref 6–23)
CHLORIDE: 105 meq/L (ref 96–112)
CO2: 29 meq/L (ref 19–32)
Calcium: 9.6 mg/dL (ref 8.4–10.5)
Creatinine, Ser: 1.1 mg/dL (ref 0.4–1.5)
GFR: 78.45 mL/min (ref >60.00)
GLUCOSE: 87 mg/dL (ref 70–99)
POTASSIUM: 4.7 meq/L (ref 3.5–5.1)
SODIUM: 141 meq/L (ref 135–145)

## 2013-11-25 LAB — LIPID PANEL
Cholesterol: 149 mg/dL (ref 0–200)
HDL: 42.8 mg/dL (ref >39.00)
LDL CALC: 85 mg/dL (ref 0–99)
NONHDL: 106.2
Total CHOL/HDL Ratio: 3
Triglycerides: 106 mg/dL (ref 0.0–149.0)
VLDL: 21.2 mg/dL (ref 0.0–40.0)

## 2013-11-25 LAB — CBC WITH DIFFERENTIAL/PLATELET
BASOS PCT: 0.3 % (ref 0.0–3.0)
Basophils Absolute: 0 10*3/uL (ref 0.0–0.1)
Eosinophils Absolute: 0.1 10*3/uL (ref 0.0–0.7)
Eosinophils Relative: 1.6 % (ref 0.0–5.0)
HCT: 41.5 % (ref 39.0–52.0)
Hemoglobin: 14 g/dL (ref 13.0–17.0)
Lymphocytes Relative: 24 % (ref 12.0–46.0)
Lymphs Abs: 1.5 10*3/uL (ref 0.7–4.0)
MCHC: 33.7 g/dL (ref 30.0–36.0)
MCV: 90.1 fl (ref 78.0–100.0)
MONOS PCT: 9.9 % (ref 3.0–12.0)
Monocytes Absolute: 0.6 10*3/uL (ref 0.1–1.0)
NEUTROS PCT: 64.2 % (ref 43.0–77.0)
Neutro Abs: 4.1 10*3/uL (ref 1.4–7.7)
PLATELETS: 278 10*3/uL (ref 150.0–400.0)
RBC: 4.61 Mil/uL (ref 4.22–5.81)
RDW: 12.9 % (ref 11.5–15.5)
WBC: 6.4 10*3/uL (ref 4.0–10.5)

## 2013-11-25 LAB — HEPATIC FUNCTION PANEL
ALBUMIN: 4.2 g/dL (ref 3.5–5.2)
ALT: 128 U/L — AB (ref 0–53)
AST: 137 U/L — AB (ref 0–37)
Alkaline Phosphatase: 53 U/L (ref 39–117)
BILIRUBIN TOTAL: 0.7 mg/dL (ref 0.2–1.2)
Bilirubin, Direct: 0.1 mg/dL (ref 0.0–0.3)
Total Protein: 7.5 g/dL (ref 6.0–8.3)

## 2013-11-25 LAB — PSA: PSA: 1.97 ng/mL (ref 0.10–4.00)

## 2013-11-25 LAB — TSH: TSH: 1.74 u[IU]/mL (ref 0.35–4.50)

## 2013-12-07 ENCOUNTER — Ambulatory Visit (INDEPENDENT_AMBULATORY_CARE_PROVIDER_SITE_OTHER): Payer: Managed Care, Other (non HMO) | Admitting: Internal Medicine

## 2013-12-07 ENCOUNTER — Encounter: Payer: Self-pay | Admitting: Internal Medicine

## 2013-12-07 VITALS — BP 136/70 | HR 69 | Temp 98.0°F | Resp 20 | Ht 67.5 in | Wt 184.0 lb

## 2013-12-07 DIAGNOSIS — Z0000 Encounter for general adult medical examination without abnormal findings: Secondary | ICD-10-CM

## 2013-12-07 DIAGNOSIS — R740 Nonspecific elevation of levels of transaminase and lactic acid dehydrogenase [LDH]: Secondary | ICD-10-CM

## 2013-12-07 DIAGNOSIS — R7401 Elevation of levels of liver transaminase levels: Secondary | ICD-10-CM

## 2013-12-07 NOTE — Progress Notes (Signed)
Subjective:    Patient ID: Jesse SayersDavid M Horne, male    DOB: 03-14-1960, 53 y.o.   MRN: [id number]  HPI [id number] year-old patient who is seen today for a health maintenance exam. He has been seen by general surgery recently due to symptomatic hemorrhoids and did have a colonoscopy performed. These remained intermittently symptomatic and he is scheduled for general surgical followup and surgery. He is now followed by allergy medicine due to allergic rhinitis. 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 He has resumed immunotherapy over the past few months He has dyslipidemia and had been on simvastatin 40.  He was increased to atorvastatin 80 mg one year ago.  Laboratory studies reviewed and revealed elevated transaminases  Allergies (verified):  No Known Drug Allergies   Past History:  Past Medical History:   1. ETT, January 2010  --13 minutes n [place]. Negative ECG  --ECHO: EF 60%  2. 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 Hyperlipidemia  Allergic rhinitis  positional vertigo   Past Surgical History:  coclonoscopy age [age]  Family History:   Father is alive, had a heart attack in his 8170s. Mother recently died of complications of heart failure  He has 2 sisters. There is no family history of  premature coronary artery disease or sudden cardiac death.   mother- History melanoma, as well as basal cell skin cancer; mother and a sister, status post lumpectomy for breast cancer   Social History:   He is married. He has 3 kids. [place] 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 He previously  smoked, but quit in 2005. He drinks 2 cups of large coffee a day. He  does some exercise  with swimming and treadmill as well as light weights     Review of Systems  Constitutional: Negative for fever, chills, activity change, appetite change and fatigue.  HENT: Negative for congestion, dental problem, ear pain, hearing loss, mouth sores, rhinorrhea, sinus pressure, sneezing, tinnitus,  trouble swallowing and voice change.   Eyes: Negative for photophobia, pain, redness and visual disturbance.  Respiratory: Negative for apnea, cough, choking, chest tightness, shortness of breath and wheezing.   Cardiovascular: Negative for chest pain, palpitations and leg swelling.  Gastrointestinal: Negative for nausea, vomiting, abdominal pain, diarrhea, constipation, blood in stool, abdominal distention, anal bleeding and rectal pain.  Genitourinary: Negative for dysuria, urgency, frequency, hematuria, flank pain, decreased urine volume, discharge, penile swelling, scrotal swelling, difficulty urinating, genital sores and testicular pain.  Musculoskeletal: Negative for arthralgias, back pain, gait problem, joint swelling, myalgias, neck pain and neck stiffness.  Skin: Negative for color change, rash and wound.  Neurological: Negative for dizziness, tremors, seizures, syncope, facial asymmetry, speech difficulty, weakness, light-headedness, numbness and headaches.  Hematological: Negative for adenopathy. Does not bruise/bleed easily.  Psychiatric/Behavioral: Negative for suicidal ideas, hallucinations, behavioral problems, confusion, sleep disturbance, self-injury, dysphoric mood, decreased concentration and agitation. The patient is not nervous/anxious.        Objective:   Physical Exam  Constitutional: He appears well-developed and well-nourished.  HENT:  Head: Normocephalic and atraumatic.  Right Ear: External ear normal.  Left Ear: External ear normal.  Nose: Nose normal.  Mouth/Throat: Oropharynx is clear and moist.  Eyes: Conjunctivae and EOM are normal. Pupils are equal, round, and reactive to light. No scleral icterus.  Neck: Normal range of motion. Neck supple. No JVD present. No thyromegaly present.  Cardiovascular: Regular rhythm, normal heart sounds and intact distal pulses.  Exam reveals  no gallop and no friction rub.   No murmur heard. Pulmonary/Chest: Effort normal and  breath sounds normal. He exhibits no tenderness.  Abdominal: Soft. Bowel sounds are normal. He exhibits no distension and no mass. There is no tenderness.  Genitourinary: Prostate normal and penis normal.  Musculoskeletal: Normal range of motion. He exhibits no edema and no tenderness.  Lymphadenopathy:    He has no cervical adenopathy.  Neurological: He is alert. He has normal reflexes. No cranial nerve deficit. Coordination normal.  Skin: Skin is warm and dry. No rash noted.  Psychiatric: He has a normal mood and affect. His behavior is normal.          Assessment & Plan:   Preventive health examination Dyslipidemia.  Elevated transaminases.  Will put atorvastatin on hold for 4 weeks and repeat transaminases.  At that time.  Consider rechallenge at a 40 mg dose Internal hemorrhoids. Follow general surgery Allergic rhinitis/history of angioedema. Followup allergy medicine  Recheck one year

## 2013-12-07 NOTE — Patient Instructions (Signed)
Hold atorvastatin for 4 weeks and repeat liver function studies at that time  Health Maintenance A healthy lifestyle and preventative care can promote health and wellness.  Maintain regular health, dental, and eye exams.  Eat a healthy diet. Foods like vegetables, fruits, whole grains, low-fat dairy products, and lean protein foods contain the nutrients you need and are low in calories. Decrease your intake of foods high in solid fats, added sugars, and salt. Get information about a proper diet from your health care provider, if necessary.  Regular physical exercise is one of the most important things you can do for your health. Most adults should get at least 150 minutes of moderate-intensity exercise (any activity that increases your heart rate and causes you to sweat) each week. In addition, most adults need muscle-strengthening exercises on 2 or more days a week.   Maintain a healthy weight. The body mass index (BMI) is a screening tool to identify possible weight problems. It provides an estimate of body fat based on height and weight. Your health care provider can find your BMI and can help you achieve or maintain a healthy weight. For males 20 years and older:  A BMI below 18.5 is considered underweight.  A BMI of 18.5 to 24.9 is normal.  A BMI of 25 to 29.9 is considered overweight.  A BMI of 30 and above is considered obese.  Maintain normal blood lipids and cholesterol by exercising and minimizing your intake of saturated fat. Eat a balanced diet with plenty of fruits and vegetables. Blood tests for lipids and cholesterol should begin at age 53 and be repeated every 5 years. If your lipid or cholesterol levels are high, you are over age 250, or you are at high risk for heart disease, you may need your cholesterol levels checked more frequently.Ongoing high lipid and cholesterol levels should be treated with medicines if diet and exercise are not working.  If you smoke, find out from  your health care provider how to quit. If you do not use tobacco, do not start.  Lung cancer screening is recommended for adults aged 55-80 years who are at high risk for developing lung cancer because of a history of smoking. A yearly low-dose CT scan of the lungs is recommended for people who have at least a 30-pack-year history of smoking and are current smokers or have quit within the past 15 years. A pack year of smoking is smoking an average of 1 pack of cigarettes a day for 1 year (for example, a 30-pack-year history of smoking could mean smoking 1 pack a day for 30 years or 2 packs a day for 15 years). Yearly screening should continue until the smoker has stopped smoking for at least 15 years. Yearly screening should be stopped for people who develop a health problem that would prevent them from having lung cancer treatment.  If you choose to drink alcohol, do not have more than 2 drinks per day. One drink is considered to be 12 oz (360 mL) of beer, 5 oz (150 mL) of wine, or 1.5 oz (45 mL) of liquor.  Avoid the use of street drugs. Do not share needles with anyone. Ask for help if you need support or instructions about stopping the use of drugs.  High blood pressure causes heart disease and increases the risk of stroke. Blood pressure should be checked at least every 1-2 years. Ongoing high blood pressure should be treated with medicines if weight loss and exercise are not  effective.  If you are 5245-53 years old, ask your health care provider if you should take aspirin to prevent heart disease.  Diabetes screening involves taking a blood sample to check your fasting blood sugar level. This should be done once every 3 years after age 53 if you are at a normal weight and without risk factors for diabetes. Testing should be considered at a younger age or be carried out more frequently if you are overweight and have at least 1 risk factor for diabetes.  Colorectal cancer can be detected and often  prevented. Most routine colorectal cancer screening begins at the age of 53 and continues through age 53. However, your health care provider may recommend screening at an earlier age if you have risk factors for colon cancer. On a yearly basis, your health care provider may provide home test kits to check for hidden blood in the stool. A small camera at the end of a tube may be used to directly examine the colon (sigmoidoscopy or colonoscopy) to detect the earliest forms of colorectal cancer. Talk to your health care provider about this at age 53 when routine screening begins. A direct exam of the colon should be repeated every 5-10 years through age 53, unless early forms of precancerous polyps or small growths are found.  People who are at an increased risk for hepatitis B should be screened for this virus. You are considered at high risk for hepatitis B if:  You were born in a country where hepatitis B occurs often. Talk with your health care provider about which countries are considered high risk.  Your parents were born in a high-risk country and you have not received a shot to protect against hepatitis B (hepatitis B vaccine).  You have HIV or AIDS.  You use needles to inject street drugs.  You live with, or have sex with, someone who has hepatitis B.  You are a man who has sex with other men (MSM).  You get hemodialysis treatment.  You take certain medicines for conditions like cancer, organ transplantation, and autoimmune conditions.  Hepatitis C blood testing is recommended for all people born from [id number] through 1965 and any individual with known risk factors for hepatitis C.  Healthy men should no longer receive prostate-specific antigen (PSA) blood tests as part of routine cancer screening. Talk to your health care provider about prostate cancer screening.  Testicular cancer screening is not recommended for adolescents or adult males who have no symptoms. Screening includes  self-exam, a health care provider exam, and other screening tests. Consult with your health care provider about any symptoms you have or any concerns you have about testicular cancer.  Practice safe sex. Use condoms and avoid high-risk sexual practices to reduce the spread of sexually transmitted infections (STIs).  You should be screened for STIs, including gonorrhea and chlamydia if:  You are sexually active and are younger than 24 years.  You are older than 24 years, and your health care provider tells you that you are at risk for this type of infection.  Your sexual activity has changed since you were last screened, and you are at an increased risk for chlamydia or gonorrhea. Ask your health care provider if you are at risk.  If you are at risk of being infected with HIV, it is recommended that you take a prescription medicine daily to prevent HIV infection. This is called pre-exposure prophylaxis (PrEP). You are considered at risk if:  You are a man  who has sex with other men (MSM).  You are a heterosexual man who is sexually active with multiple partners.  You take drugs by injection.  You are sexually active with a partner who has HIV.  Talk with your health care provider about whether you are at high risk of being infected with HIV. If you choose to begin PrEP, you should first be tested for HIV. You should then be tested every 3 months for as long as you are taking PrEP.  Use sunscreen. Apply sunscreen liberally and repeatedly throughout the day. You should seek shade when your shadow is shorter than you. Protect yourself by wearing long sleeves, pants, a wide-brimmed hat, and sunglasses year round whenever you are outdoors.  Tell your health care provider of new moles or changes in moles, especially if there is a change in shape or color. Also, tell your health care provider if a mole is larger than the size of a pencil eraser.  A one-time screening for abdominal aortic aneurysm  (AAA) and surgical repair of large AAAs by ultrasound is recommended for men aged 48-75 years who are current or former smokers.  Stay current with your vaccines (immunizations). Document Released: 08/10/2007 Document Revised: 02/16/2013 Document Reviewed: 07/09/2010 [facility] Patient Information 2015 [place], [place]. This information is not intended to replace advice given to you by your health care provider. Make sure you discuss any questions you have with your health care provider.

## 2013-12-07 NOTE — Progress Notes (Signed)
Pre visit review using our clinic review tool, if applicable. No additional management support is needed unless otherwise documented below in the visit note. 

## 2013-12-24 ENCOUNTER — Encounter (HOSPITAL_BASED_OUTPATIENT_CLINIC_OR_DEPARTMENT_OTHER): Payer: Self-pay | Admitting: *Deleted

## 2013-12-24 NOTE — Progress Notes (Signed)
NPO AFTER MN. ARRIVE AT 0730. NEEDS HG. WILL TAKE XYZAL AM DOS W/ SIPS OF WATER.

## 2013-12-24 NOTE — Progress Notes (Signed)
12/24/13 1511  OBSTRUCTIVE SLEEP APNEA  Have you ever been diagnosed with sleep apnea through a sleep study? No  Do you snore loudly (loud enough to be heard through closed doors)?  1  Do you often feel tired, fatigued, or sleepy during the daytime? 0  Has anyone observed you stop breathing during your sleep? 0  Do you have, or are you being treated for high blood pressure? 0  BMI more than 35 kg/m2? 0  Age over 612 years old? 1  Neck circumference greater than 40 cm/16 inches? 1  Gender: 1  Obstructive Sleep Apnea Score 4  Score 4 or greater  Results sent to PCP

## 2013-12-29 ENCOUNTER — Ambulatory Visit (HOSPITAL_BASED_OUTPATIENT_CLINIC_OR_DEPARTMENT_OTHER)
Admission: RE | Admit: 2013-12-29 | Discharge: 2013-12-29 | Disposition: A | Discharge status: Home / Self Care | Payer: Managed Care, Other (non HMO) | Source: Ambulatory Visit | Attending: General Surgery | Admitting: General Surgery

## 2013-12-29 ENCOUNTER — Encounter (HOSPITAL_BASED_OUTPATIENT_CLINIC_OR_DEPARTMENT_OTHER): Payer: Self-pay | Admitting: Anesthesiology

## 2013-12-29 ENCOUNTER — Encounter (HOSPITAL_BASED_OUTPATIENT_CLINIC_OR_DEPARTMENT_OTHER)
Admission: RE | Disposition: A | Discharge status: Home / Self Care | Payer: Self-pay | Source: Ambulatory Visit | Attending: General Surgery

## 2013-12-29 ENCOUNTER — Ambulatory Visit (HOSPITAL_BASED_OUTPATIENT_CLINIC_OR_DEPARTMENT_OTHER): Payer: Managed Care, Other (non HMO) | Admitting: Anesthesiology

## 2013-12-29 DIAGNOSIS — K648 Other hemorrhoids: Secondary | ICD-10-CM | POA: Insufficient documentation

## 2013-12-29 DIAGNOSIS — E780 Pure hypercholesterolemia: Secondary | ICD-10-CM | POA: Diagnosis not present

## 2013-12-29 DIAGNOSIS — Z79899 Other long term (current) drug therapy: Secondary | ICD-10-CM | POA: Insufficient documentation

## 2013-12-29 HISTORY — DX: Family history of other specified conditions: Z84.89

## 2013-12-29 HISTORY — DX: Other hemorrhoids: K64.8

## 2013-12-29 HISTORY — DX: Presence of spectacles and contact lenses: Z97.3

## 2013-12-29 HISTORY — DX: Other specified personal risk factors, not elsewhere classified: Z91.89

## 2013-12-29 HISTORY — PX: TRANSANAL HEMORRHOIDAL DEARTERIALIZATION: SHX6136

## 2013-12-29 HISTORY — DX: Other seasonal allergic rhinitis: J30.2

## 2013-12-29 HISTORY — DX: Hyperlipidemia, unspecified: E78.5

## 2013-12-29 HISTORY — DX: Other allergic rhinitis: J30.89

## 2013-12-29 LAB — POCT HEMOGLOBIN-HEMACUE: Hemoglobin: 14.7 g/dL (ref 13.0–17.0)

## 2013-12-29 SURGERY — TRANSANAL HEMORRHOIDAL DEARTERIALIZATION
Anesthesia: Monitor Anesthesia Care | Site: Rectum

## 2013-12-29 MED ORDER — FENTANYL CITRATE 0.05 MG/ML IJ SOLN
INTRAMUSCULAR | Status: AC
  Filled 2013-12-29: qty 2

## 2013-12-29 MED ORDER — LACTATED RINGERS IV SOLN
INTRAVENOUS | Status: DC
  Administered 2013-12-29 (×2): via INTRAVENOUS
  Filled 2013-12-29: qty 1000

## 2013-12-29 MED ORDER — DIAZEPAM 5 MG PO TABS: 5.0000 mg | ORAL_TABLET | Freq: Four times a day (QID) | ORAL | Status: DC | PRN

## 2013-12-29 MED ORDER — OXYCODONE-ACETAMINOPHEN 5-325 MG PO TABS: 1.0000 | ORAL_TABLET | Freq: Four times a day (QID) | ORAL | Status: DC | PRN

## 2013-12-29 MED ORDER — OXYCODONE HCL 5 MG PO TABS
5.0000 mg | ORAL_TABLET | ORAL | Status: DC | PRN
  Administered 2013-12-29: 5 mg via ORAL
  Filled 2013-12-29: qty 2

## 2013-12-29 MED ORDER — ONDANSETRON HCL 4 MG/2ML IJ SOLN
INTRAMUSCULAR | Status: DC | PRN
  Administered 2013-12-29: 4 mg via INTRAVENOUS

## 2013-12-29 MED ORDER — KETAMINE HCL 10 MG/ML IJ SOLN
INTRAMUSCULAR | Status: AC
  Filled 2013-12-29: qty 1

## 2013-12-29 MED ORDER — ACETAMINOPHEN 325 MG PO TABS
650.0000 mg | ORAL_TABLET | ORAL | Status: DC | PRN
  Filled 2013-12-29: qty 2

## 2013-12-29 MED ORDER — BUPIVACAINE LIPOSOME 1.3 % IJ SUSP
INTRAMUSCULAR | Status: DC | PRN
  Administered 2013-12-29: 20 mL

## 2013-12-29 MED ORDER — DEXAMETHASONE SODIUM PHOSPHATE 10 MG/ML IJ SOLN
INTRAMUSCULAR | Status: DC | PRN
  Administered 2013-12-29: 10 mg via INTRAVENOUS

## 2013-12-29 MED ORDER — SODIUM CHLORIDE 0.9 % IV SOLN
250.0000 mL | INTRAVENOUS | Status: DC | PRN
  Filled 2013-12-29: qty 250

## 2013-12-29 MED ORDER — FENTANYL CITRATE 0.05 MG/ML IJ SOLN
25.0000 ug | INTRAMUSCULAR | Status: DC | PRN
  Administered 2013-12-29 (×2): 50 ug via INTRAVENOUS
  Filled 2013-12-29: qty 1

## 2013-12-29 MED ORDER — SODIUM CHLORIDE 0.9 % IJ SOLN
3.0000 mL | Freq: Two times a day (BID) | INTRAMUSCULAR | Status: DC
  Filled 2013-12-29: qty 3

## 2013-12-29 MED ORDER — 0.9 % SODIUM CHLORIDE (POUR BTL) OPTIME
TOPICAL | Status: DC | PRN
  Administered 2013-12-29: 1000 mL

## 2013-12-29 MED ORDER — FENTANYL CITRATE 0.05 MG/ML IJ SOLN
INTRAMUSCULAR | Status: DC | PRN
  Administered 2013-12-29 (×2): 50 ug via INTRAVENOUS

## 2013-12-29 MED ORDER — ACETAMINOPHEN 650 MG RE SUPP
650.0000 mg | RECTAL | Status: DC | PRN
  Filled 2013-12-29: qty 1

## 2013-12-29 MED ORDER — KETAMINE HCL 10 MG/ML IJ SOLN
INTRAMUSCULAR | Status: DC | PRN
  Administered 2013-12-29: 50 mg via INTRAVENOUS

## 2013-12-29 MED ORDER — MIDAZOLAM HCL 2 MG/2ML IJ SOLN
INTRAMUSCULAR | Status: AC
  Filled 2013-12-29: qty 2

## 2013-12-29 MED ORDER — FENTANYL CITRATE 0.05 MG/ML IJ SOLN
INTRAMUSCULAR | Status: AC
  Filled 2013-12-29: qty 4

## 2013-12-29 MED ORDER — LIDOCAINE HCL (CARDIAC) 20 MG/ML IV SOLN
INTRAVENOUS | Status: DC | PRN
  Administered 2013-12-29: 50 mg via INTRAVENOUS

## 2013-12-29 MED ORDER — BUPIVACAINE-EPINEPHRINE 0.5% -1:200000 IJ SOLN
INTRAMUSCULAR | Status: DC | PRN
  Administered 2013-12-29: 20 mL

## 2013-12-29 MED ORDER — SODIUM CHLORIDE 0.9 % IJ SOLN
3.0000 mL | INTRAMUSCULAR | Status: DC | PRN
  Filled 2013-12-29: qty 3

## 2013-12-29 MED ORDER — MIDAZOLAM HCL 5 MG/5ML IJ SOLN
INTRAMUSCULAR | Status: DC | PRN
  Administered 2013-12-29: 2 mg via INTRAVENOUS

## 2013-12-29 MED ORDER — PROPOFOL 10 MG/ML IV EMUL
INTRAVENOUS | Status: DC | PRN
  Administered 2013-12-29 (×2): 200 ug/kg/min via INTRAVENOUS

## 2013-12-29 MED ORDER — OXYCODONE HCL 5 MG PO TABS
ORAL_TABLET | ORAL | Status: AC
  Filled 2013-12-29: qty 1

## 2013-12-29 MED ORDER — PROMETHAZINE HCL 25 MG/ML IJ SOLN
6.2500 mg | INTRAMUSCULAR | Status: DC | PRN
  Filled 2013-12-29: qty 1

## 2013-12-29 SURGICAL SUPPLY — 37 items
BLADE HEX COATED 2.75 (ELECTRODE) IMPLANT
BRIEF STRETCH FOR OB PAD LRG (UNDERPADS AND DIAPERS) ×3 IMPLANT
COVER TABLE BACK 60X90 (DRAPES) ×3 IMPLANT
DECANTER SPIKE VIAL GLASS SM (MISCELLANEOUS) IMPLANT
DRAPE LG THREE QUARTER DISP (DRAPES) ×3 IMPLANT
DRAPE UTILITY XL STRL (DRAPES) IMPLANT
DRSG PAD ABDOMINAL 8X10 ST (GAUZE/BANDAGES/DRESSINGS) ×3 IMPLANT
ELECT REM PT RETURN 9FT ADLT (ELECTROSURGICAL) ×3
ELECTRODE REM PT RTRN 9FT ADLT (ELECTROSURGICAL) ×1 IMPLANT
GAUZE SPONGE 4X4 12PLY STRL (GAUZE/BANDAGES/DRESSINGS) IMPLANT
GAUZE SPONGE 4X4 16PLY XRAY LF (GAUZE/BANDAGES/DRESSINGS) ×3 IMPLANT
GLOVE BIO SURGEON STRL SZ 6.5 (GLOVE) ×6 IMPLANT
GLOVE BIO SURGEONS STRL SZ 6.5 (GLOVE) ×3
GLOVE BIOGEL PI IND STRL 7.0 (GLOVE) ×2 IMPLANT
GLOVE BIOGEL PI INDICATOR 7.0 (GLOVE) ×4
GOWN STRL REUS W/TWL 2XL LVL3 (GOWN DISPOSABLE) ×3 IMPLANT
GOWN STRL REUS W/TWL XL LVL3 (GOWN DISPOSABLE) ×3 IMPLANT
HEMOSTAT SURGICEL 4X8 (HEMOSTASIS) IMPLANT
KIT SLIDE ONE PROLAPS HEMORR (KITS) ×3 IMPLANT
LEGGING LITHOTOMY PAIR STRL (DRAPES) ×3 IMPLANT
LUBRICANT JELLY K Y 4OZ (MISCELLANEOUS) ×3 IMPLANT
NEEDLE HYPO 22GX1.5 SAFETY (NEEDLE) ×3 IMPLANT
NS IRRIG 1000ML POUR BTL (IV SOLUTION) ×3 IMPLANT
PACK BASIN DAY SURGERY FS (CUSTOM PROCEDURE TRAY) ×3 IMPLANT
PENCIL BUTTON HOLSTER BLD 10FT (ELECTRODE) IMPLANT
SPONGE HEMORRHOID 8X3CM (HEMOSTASIS) IMPLANT
SUT CHROMIC 2 0 SH (SUTURE) IMPLANT
SUT CHROMIC 3 0 SH 27 (SUTURE) IMPLANT
SUT VIC AB 2-0 UR6 27 (SUTURE) IMPLANT
SYR CONTROL 10ML LL (SYRINGE) ×3 IMPLANT
SYRINGE 20CC LL (MISCELLANEOUS) IMPLANT
TOWEL OR 17X24 6PK STRL BLUE (TOWEL DISPOSABLE) ×3 IMPLANT
TOWEL OR NON WOVEN STRL DISP B (DISPOSABLE) ×3 IMPLANT
TRAY DSU PREP LF (CUSTOM PROCEDURE TRAY) ×3 IMPLANT
TUBE CONNECTING 12'X1/4 (SUCTIONS) ×1
TUBE CONNECTING 12X1/4 (SUCTIONS) ×2 IMPLANT
YANKAUER SUCT BULB TIP NO VENT (SUCTIONS) ×3 IMPLANT

## 2013-12-29 NOTE — Op Note (Signed)
12/29/2013  10:02 AM  PATIENT:  Jesse Horne  53 y.o. male  Patient Care Team: Gordy SaversPeter F Kwiatkowski, MD as PCP - General  PRE-OPERATIVE DIAGNOSIS:  bleeding internal hemorrhoids  POST-OPERATIVE DIAGNOSIS:  bleeding internal hemorrhoids  PROCEDURE:  TRANSANAL HEMORRHOIDAL DEARTERIALIZATION   Surgeon(s): Romie LeveeAlicia Reah Justo, MD  ASSISTANT: none   ANESTHESIA:   local and general  EBL:  Total I/O In: 700 [I.V.:700] Out: -   DRAINS: none   SPECIMEN:  No Specimen  DISPOSITION OF SPECIMEN:  N/A  COUNTS:  YES  PLAN OF CARE: Discharge to home after PACU  PATIENT DISPOSITION:  PACU - hemodynamically stable.  INDICATION: This is a 53 y.o. M who has bleeding internal hemorrhoids.  He has failed conservative management and is here for operative treatment.     OR FINDINGS: large RP and LL hemorrhoids  DESCRIPTION: the patient was identified in the preoperative holding area and taken to the OR where they were laid supine on the operating room table.  General anesthesia was induced without difficulty. SCDs were also noted to be in place prior to the initiation of anesthesia.  The patient was placed in lithotomy position and then prepped and draped in the usual sterile fashion.   A surgical timeout was performed indicating the correct patient, procedure, positioning and need for preoperative antibiotics.  A rectal block was performed using Marcaine with epinephrine.  Informed consent was confirmed. Patient underwent general anesthesia without difficulty. Patient was placed into prone positioning.  The perianal region was prepped and draped in sterile fashion. Surgical tunnel confirmed or plan.  I did digital rectal examination and then transitioned over to anoscopy to get a sense of the anatomy.  [facility] Doppler [facility].   Using the Doppler on the tip of the THD anoscope, I identified the arterial hemorrhoidal vessels coming in in the classic hexagonal  anatomical pattern  (right posterior/lateral/anterior, left posterior /lateral/anterior).    I proceeded to ligate the hemorrhoidal arteries. I used a 2-0 Vicryl suture on a UR-6 needle in a figure-of-eight fashion over the signal around 6 cm proximal to the anal verge. I then ran that stitch longitudinally more distally to the white line of [place]. I then tied that stitch down to cause a hemorrhoidopexy. I did that for all 6 locations.    I redid Doppler anoscopy. I Identified a signal at the R anterior and L anterior locations.  I isolated and ligated this with a figure-of-eight stitch. Signals went away.  At completion of this, all hemorrhoids were reduced into the rectum. There was a small skin tag that was left in place. There is no more prolapse.   I repeated anoscopy and examination.   Hemostasis was good. I placed a soft Gelfoam cylinder into the rectum. Patient is being extubated go to recovery room.  I am about to discuss the patient's status to the family.

## 2013-12-29 NOTE — Anesthesia Preprocedure Evaluation (Addendum)
Anesthesia Evaluation  Patient identified by MRN, date of birth, ID band Patient awake    Reviewed: Allergy & Precautions, H&P , NPO status , Patient's Chart, lab work & pertinent test results  History of Anesthesia Complications Negative for: history of anesthetic complications  Airway Mallampati: II  TM Distance: >3 FB Neck ROM: Full    Dental no notable dental hx. (+) Teeth Intact, Dental Advisory Given,    Pulmonary neg pulmonary ROS,  breath sounds clear to auscultation  Pulmonary exam normal       Cardiovascular Exercise Tolerance: Good negative cardio ROS  Rhythm:Regular Rate:Normal     Neuro/Psych Anxiety negative neurological ROS  negative psych ROS   GI/Hepatic negative GI ROS, Neg liver ROS, Hemorrhoids   Endo/Other  negative endocrine ROS  Renal/GU negative Renal ROS  negative genitourinary   Musculoskeletal negative musculoskeletal ROS (+)   Abdominal   Peds negative pediatric ROS (+)  Hematology negative hematology ROS (+)   Anesthesia Other Findings Upper front Teeth are bonded  Reproductive/Obstetrics negative OB ROS                        Anesthesia Physical Anesthesia Plan  ASA: I  Anesthesia Plan: MAC   Post-op Pain Management:    Induction: Intravenous  Airway Management Planned: Nasal Cannula  Additional Equipment:   Intra-op Plan:   Post-operative Plan:   Informed Consent: I have reviewed the patients History and Physical, chart, labs and discussed the procedure including the risks, benefits and alternatives for the proposed anesthesia with the patient or authorized representative who has indicated his/her understanding and acceptance.   Dental advisory given  Plan Discussed with: CRNA and Surgeon  Anesthesia Plan Comments:        Anesthesia Quick Evaluation

## 2013-12-29 NOTE — Discharge Instructions (Addendum)
ANORECTAL SURGERY: POST OP INSTRUCTIONS °1. Take your usually prescribed home medications unless otherwise directed. °2. DIET: During the first few hours after surgery sip on some liquids until you are able to urinate.  It is normal to not urinate for several hours after this surgery.  If you feel uncomfortable, please contact the office for instructions.  After you are able to urinate,you may eat, if you feel like it.  Follow a light bland diet the first 24 hours after arrival home, such as soup, liquids, crackers, etc.  Be sure to include lots of fluids daily (6-8 glasses).  Avoid fast food or heavy meals, as your are more likely to get nauseated.  Eat a low fat diet the next few days after surgery.  Limit caffeine intake to 1-2 servings a day. °3. PAIN CONTROL: °a. Pain is best controlled by a usual combination of several different methods TOGETHER: °i. Muscle relaxation °1.  Soak in a warm bath (or Sitz bath) three times a day and after bowel movements.  Continue to do this until all pain is resolved. °2. Take the muscle relaxer (Valium) every 6 hours for the first 2 days after surgery  °ii. Over the counter pain medication °iii. Prescription pain medication °b. Most patients will experience some swelling and discomfort in the anus/rectal area and incisions.  Heat such as warm towels, sitz baths, warm baths, etc to help relax tight/sore spots and speed recovery.  Some people prefer to use ice, especially in the first couple days after surgery, as it may decrease the pain and swelling, or alternate between ice & heat.  Experiment to what works for you.  Swelling and bruising can take several weeks to resolve.  Pain can take even longer to completely resolve. °c. It is helpful to take an over-the-counter pain medication regularly for the first few weeks.  Choose one of the following that works best for you: °i. Naproxen (Aleve, etc)  Two 220mg tabs twice a day °ii. Ibuprofen (Advil, etc) Three 200mg tabs four  times a day (every meal & bedtime) °d. A  prescription for pain medication (such as percocet, oxycodone, hydrocodone, etc) should be given to you upon discharge.  Take your pain medication as prescribed.  °i. If you are having problems/concerns with the prescription medicine (does not control pain, nausea, vomiting, rash, itching, etc), please call us [phone] to see if we need to switch you to a different pain medicine that will work better for you and/or control your side effect better. °ii. If you need a refill on your pain medication, please contact your pharmacy.  They will contact our office to request authorization. Prescriptions will not be filled after 5 pm or on week-ends. °4. KEEP YOUR BOWELS REGULAR and AVOID CONSTIPATION °a. The goal is one to two soft bowel movements a day.  You should at least have a bowel movement every other day. °b. Avoid getting constipated.  Between the surgery and the pain medications, it is common to experience some constipation. This can be very painful after rectal surgery.  Increasing fluid intake and taking a fiber supplement (such as Metamucil, Citrucel, FiberCon, etc) 1-2 times a day regularly will usually help prevent this problem from occurring.  A stool softener like colace is also recommended.  This can be purchased over the counter at your pharmacy.  You can take it up to 3 times a day.  If you do not have a bowel movement after 24 hrs since your surgery,   take one does of milk of magnesia.  If you still haven't had a bowel movement 8-12 hours after that dose, take another dose.  If you don't have a bowel movement 48 hrs after surgery, purchase a Fleets enema from the drug store and administer gently per package instructions.  If you still are having trouble with your bowel movements after that, please call the office for further instructions. °c. If you develop diarrhea or have many loose bowel movements, simplify your diet to bland foods & liquids for a few  days.  Stop any stool softeners and decrease your fiber supplement.  Switching to mild anti-diarrheal medications (Kayopectate, Pepto Bismol) can help.  If this worsens or does not improve, please call us. ° °5. Wound Care °a. Remove your bandages before your first bowel movement or 8 hours after surgery.     °b. Remove any wound packing material at this tim,e as well.  You do not need to repack the wound unless instructed otherwise.  Wear an absorbent pad or soft cotton gauze in your underwear to catch any drainage and help keep the area clean. You should change this every 2-3 hours while awake. °c. Keep the area clean and dry.  Bathe / shower every day, especially after bowel movements.  Keep the area clean by showering / bathing over the incision / wound.   It is okay to soak an open wound to help wash it.  Wet wipes or showers / gentle washing after bowel movements is often less traumatic than regular toilet paper. °d. You may have some styrofoam-like soft packing in the rectum which will come out with the first bowel movement.  °e. You will often notice bleeding with bowel movements.  This should slow down by the end of the first week of surgery °f. Expect some drainage.  This should slow down, too, by the end of the first week of surgery.  Wear an absorbent pad or soft cotton gauze in your underwear until the drainage stops. °g. Do Not sit on a rubber or pillow ring.  This can make you symptoms worse.  You may sit on a soft pillow if needed.  °6. ACTIVITIES as tolerated:   °a. You may resume regular (light) daily activities beginning the next day--such as daily self-care, walking, climbing stairs--gradually increasing activities as tolerated.  If you can walk 30 minutes without difficulty, it is safe to try more intense activity such as jogging, treadmill, bicycling, low-impact aerobics, swimming, etc. °b. Save the most intensive and strenuous activity for last such as sit-ups, heavy lifting, contact sports,  etc  Refrain from any heavy lifting or straining until you are off narcotics for pain control.   °c. You may drive when you are no longer taking prescription pain medication, you can comfortably sit for long periods of time, and you can safely maneuver your car and apply brakes. °d. You may have sexual intercourse when it is comfortable.  °7. FOLLOW UP in our office °a. Please call [facility] at [phone] to set up an appointment to see your surgeon in the office for a follow-up appointment approximately 3-4 weeks after your surgery. °b. Make sure that you call for this appointment the day you arrive home to insure a convenient appointment time. °10. IF YOU HAVE DISABILITY OR FAMILY LEAVE FORMS, BRING THEM TO THE OFFICE FOR PROCESSING.  DO NOT GIVE THEM TO YOUR DOCTOR. ° ° ° ° °WHEN TO CALL US [phone]: °1. Poor pain control °  2. Reactions / problems with new medications (rash/itching, nausea, etc)  3. Fever over 101.5 F (38.5 C) 4. Inability to urinate 5. Nausea and/or vomiting 6. Worsening swelling or bruising 7. Continued bleeding from incision. 8. Increased pain, redness, or drainage from the incision  The clinic staff is available to answer your questions during regular business hours (8:30am-5pm).  Please dont hesitate to call and ask to speak to one of our nurses for clinical concerns.   A surgeon from [facility] is always on call at the hospitals   If you have a medical emergency, go to the nearest emergency room or call 911.    [facility] [address], [address], [place], [place]  [address] ? MAIN: [phone] ? TOLL FREE: [phone] ? FAX [phone] [email or web address]    Bupivacaine Liposomal Suspension for Injection What is this medicine? BUPIVACAINE LIPOSOMAL (bue PIV a kane LIP oh som al) is an anesthetic. It causes loss of feeling in the skin or other tissues. It is used to prevent and to treat pain from some  procedures. This medicine may be used for other purposes; ask your health care provider or pharmacist if you have questions. COMMON BRAND NAME(S): EXPAREL What should I tell my health care provider before I take this medicine? They need to know if you have any of these conditions: -heart disease -kidney disease -liver disease -an unusual or allergic reaction to bupivacaine, other medicines, foods, dyes, or preservatives -pregnant or trying to get pregnant -breast-feeding How should I use this medicine? This medicine is for injection into the affected area. It is given by a health care professional in a hospital or clinic setting. Talk to your pediatrician regarding the use of this medicine in children. Special care may be needed. Overdosage: If you think you've taken too much of this medicine contact a poison control center or emergency room at once. Overdosage: If you think you have taken too much of this medicine contact a poison control center or emergency room at once. NOTE: This medicine is only for you. Do not share this medicine with others. What if I miss a dose? This does not apply. What may interact with this medicine? -other anesthetics This list may not describe all possible interactions. Give your health care provider a list of all the medicines, herbs, non-prescription drugs, or dietary supplements you use. Also tell them if you smoke, drink alcohol, or use illegal drugs. Some items may interact with your medicine. What should I watch for while using this medicine? Your condition will be monitored carefully while you are receiving this medicine. Be careful to avoid injury while the area is numb and you are not aware of pain. Do not use another medicine that contains bupivacaine for 96 hours after receiving bupivacaine liposomal. You may have more side effects. Talk to your doctor if you have questions. What side effects may I notice from receiving this medicine? Side effects  that you should report to your doctor or health care professional as soon as possible: -allergic reactions like skin rash, itching or hives, swelling of the face, lips, or tongue -breathing problems -changes in vision -dizziness -fast or slow, irregular heartbeat -joint pain, stiffness, or loss of motion -seizures Side effects that usually do not require medical attention (Report these to your doctor or health care professional if they continue or are bothersome.): -constipation -irritation at site where injected -nausea, vomiting -tiredness This list may not describe all possible side effects.  Call your doctor for medical advice about side effects. You may report side effects to FDA at 1-800-FDA-1088. Where should I keep my medicine? This drug is given in a hospital or clinic and will not be stored at home. NOTE: This sheet is a summary. It may not cover all possible information. If you have questions about this medicine, talk to your doctor, pharmacist, or health care provider.  2015, Elsevier/Gold Standard. (2012-07-07 12:35:57)     Post Anesthesia Home Care Instructions  Activity: Get plenty of rest for the remainder of the day. A responsible adult should stay with you for 24 hours following the procedure.  For the next 24 hours, DO NOT: -Drive a car -Advertising copywriterperate machinery -Drink alcoholic beverages -Take any medication unless instructed by your physician -Make any legal decisions or sign important papers.  Meals: Start with liquid foods such as gelatin or soup. Progress to regular foods as tolerated. Avoid greasy, spicy, heavy foods. If nausea and/or vomiting occur, drink only clear liquids until the nausea and/or vomiting subsides. Call your physician if vomiting continues.  Special Instructions/Symptoms: Your throat may feel dry or sore from the anesthesia or the breathing tube placed in your throat during surgery. If this causes discomfort, gargle with warm salt water. The  discomfort should disappear within 24 hours.   Call your surgeon if you experience:   1.  Fever over 101.0. 2.  Inability to urinate. 3.  Nausea and/or vomiting. 4.  Extreme swelling or bruising at the surgical site. 5.  Continued bleeding from the incision. 6.  Increased pain, redness or drainage from the incision. 7.  Problems related to your pain medication. 8. Any change in color, movement and/or sensation 9. Any problems and/or concerns

## 2013-12-29 NOTE — Transfer of Care (Signed)
Immediate Anesthesia Transfer of Care Note  Patient: Jesse Horne  Procedure(s) Performed: Procedure(s): TRANSANAL HEMORRHOIDAL DEARTERIALIZATION (N/A)  Patient Location: PACU  Anesthesia Type:General  Level of Consciousness: awake, alert , oriented and patient cooperative  Airway & Oxygen Therapy: Patient Spontanous Breathing and Patient connected to nasal cannula oxygen  Post-op Assessment: Report given to PACU RN and Post -op Vital signs reviewed and stable  Post vital signs: Reviewed and stable  Complications: No apparent anesthesia complications

## 2013-12-29 NOTE — H&P (Signed)
  History of Present Illness Jesse Horne(Jesse Verhagen MD; 11/02/2013 12:35 PM) Patient words: f/u for rectal bleeding.  The patient is a 53 year old male who presents with hemorrhoids. The last clinic visit was 4 week(s) ago. Management changes made at the last visit include adding hemorrhoid banding. Symptoms include rectal bleeding. The banding did not seem to relieve his symptoms. The patient describes this as mild. Symptoms are exacerbated by bowel movements. Current treatment includes hemorrhoid creams, stool softeners, increased dietary fiber and fiber supplements. By report there is good compliance with treatment, but poor symptom control. Past evaluation has included colonoscopy, which was negative.   Other Problems Jesse Horne(Jesse Horne, [place]; 11/02/2013 12:06 PM) Hypercholesterolemia  Past Surgical History Jesse Horne(Jesse Horne, [place]; 11/02/2013 12:06 PM) No pertinent past surgical history  Diagnostic Studies History Jesse Horne(Jesse Horne, [place]; 11/02/2013 12:06 PM) Colonoscopy 1-5 years ago  Allergies (Jesse Horne, RMA; 11/02/2013 12:12 PM) No Known Drug Allergies09/09/2013 (Marked as Inactive) Cat Dander Dog Dander Pollen Extracts *ALTERNATIVE MEDICINES*  Medication History (Jesse Horne, RMA; 11/02/2013 12:07 PM) Lipitor (80MG  Tablet, Oral) Active. EpiPen 2-Pak (0.3 MG/0.3ML(1:1000) Device, Intramuscular) Active. Xyzal (5MG  Tablet, Oral) Active. Reguloid (0.52GM Capsule, Oral) Active.  Review of Systems (Jesse Horne RMA; 11/02/2013 12:06 PM) Gen: no fevers or chills CV: No SOB or Chest pain Lung: no cough Gastrointestinal Present- Bloody Stool. Not Present- Abdominal Pain, Bloating, Change in Bowel Habits, Chronic diarrhea, Constipation, Difficulty Swallowing, Excessive gas, Gets full quickly at meals, Hemorrhoids, Indigestion, Nausea, Rectal Pain and Vomiting. GU: no frequency or urgency  Vitals (Jesse Horne RMA; 11/02/2013 12:16 PM) BP 140/98  mmHg  Pulse 76  Temp(Src) 97.8 F (36.6 C) (Oral)  Resp 16  Ht 5\' 7"  (1.702 m)  Wt 179 lb (81.194 kg)  BMI 28.03 kg/m2  SpO2 99%    Physical Exam Jesse Horne(Jesse Granlund MD; 11/02/2013 12:31 PM) General Mental Status-Alert. General Appearance-Cooperative. CV: RRR Lungs: CTA Abdomen Palpation/Percussion Palpation and Percussion of the abdomen reveal - Soft and Non Tender.    Assessment & Plan Jesse Horne(Jesse Moynahan MD; 11/02/2013 12:37 PM) BLEEDING INTERNAL HEMORRHOIDS (455.2  K64.8) Story: patient has persistent bleeding internal hemorrhoids despite changes in dietary fiber intake and in the office hemorrhoid banding. Impression: We discussed his ongoing symptoms today.Given that he has not had much improvement with his banding, I recommended performing THD. he is in agreement to this. We discussed that this will not treat external hemorrhoids and could exacerbate external hemorrhoids as well.I do not think that this would be the case. We discussed that he would need to take some time off of work afterwards for pain control issues. We discussed that it has a 98% chance of controlling his bleeding.

## 2013-12-29 NOTE — Anesthesia Postprocedure Evaluation (Signed)
  Anesthesia Post-op Note  Patient: Jesse Horne  Procedure(s) Performed: Procedure(s) (LRB): TRANSANAL HEMORRHOIDAL DEARTERIALIZATION (N/A)  Patient Location: PACU  Anesthesia Type: General  Level of Consciousness: awake and alert   Airway and Oxygen Therapy: Patient Spontanous Breathing  Post-op Pain: mild  Post-op Assessment: Post-op Vital signs reviewed, Patient's Cardiovascular Status Stable, Respiratory Function Stable, Patent Airway and No signs of Nausea or vomiting  Last Vitals:  Filed Vitals:   12/29/13 1015  BP: 151/90  Pulse: 87  Temp:   Resp: 17    Post-op Vital Signs: stable   Complications: No apparent anesthesia complications

## 2013-12-29 NOTE — Anesthesia Procedure Notes (Signed)
Procedure Name: LMA Insertion Date/Time: 12/29/2013 9:15 AM Performed by: Tyrone NineSAUVE, Rigoberto Repass F Pre-anesthesia Checklist: Patient identified, Emergency Drugs available, Suction available, Patient being monitored and Timeout performed Patient Re-evaluated:Patient Re-evaluated prior to inductionOxygen Delivery Method: Circle system utilized Preoxygenation: Pre-oxygenation with 100% oxygen Intubation Type: IV induction Ventilation: Mask ventilation without difficulty LMA: LMA inserted LMA Size: 4.0 Number of attempts: 1 Airway Equipment and Method: Bite block Placement Confirmation: positive ETCO2 and breath sounds checked- equal and bilateral Tube secured with: Tape Dental Injury: Teeth and Oropharynx as per pre-operative assessment

## 2013-12-30 ENCOUNTER — Encounter (HOSPITAL_BASED_OUTPATIENT_CLINIC_OR_DEPARTMENT_OTHER): Payer: Self-pay | Admitting: General Surgery

## 2014-01-05 ENCOUNTER — Telehealth (INDEPENDENT_AMBULATORY_CARE_PROVIDER_SITE_OTHER): Payer: Self-pay

## 2014-01-05 NOTE — Telephone Encounter (Signed)
Pt s/p hemorrhoid surgery on 12/29/13.  He returned to work this past Monday.  He is calling today in pain which he rates at a 6, out of 10, but worse with bowel movements.  He has been doing sitz baths, and there is no bleeding.  He would like to refill his pain medication.  I explained to the patient that he may have returned to work too soon. I told him we hesitate to prescribe any more pain medication due to risk of constipation.  His stools are regular and soft.  He is taking a fiber supplement (.52 gm, 6 tablets every morning), and Miralax daily.  Please advise if pt needs more time out of work.

## 2014-01-05 NOTE — Telephone Encounter (Signed)
Pt may p/u his Rx when he receives a call that it is ready.  I explained this would be later today, but before closing. He will stay out of work until his post op appt on 01/11/14.  No RTW note required.

## 2014-01-05 NOTE — Telephone Encounter (Signed)
Can give him a refill, but he should not be working or driving while on pain meds.

## 2014-01-06 ENCOUNTER — Other Ambulatory Visit (INDEPENDENT_AMBULATORY_CARE_PROVIDER_SITE_OTHER): Payer: Managed Care, Other (non HMO)

## 2014-01-06 DIAGNOSIS — R7401 Elevation of levels of liver transaminase levels: Secondary | ICD-10-CM

## 2014-01-06 DIAGNOSIS — R740 Nonspecific elevation of levels of transaminase and lactic acid dehydrogenase [LDH]: Secondary | ICD-10-CM

## 2014-01-06 DIAGNOSIS — Z0000 Encounter for general adult medical examination without abnormal findings: Secondary | ICD-10-CM

## 2014-01-06 LAB — HEPATIC FUNCTION PANEL
ALBUMIN: 3.1 g/dL — AB (ref 3.5–5.2)
ALT: 82 U/L — ABNORMAL HIGH (ref 0–53)
AST: 38 U/L — ABNORMAL HIGH (ref 0–37)
Alkaline Phosphatase: 113 U/L (ref 39–117)
Bilirubin, Direct: 0 mg/dL (ref 0.0–0.3)
Total Bilirubin: 0.7 mg/dL (ref 0.2–1.2)
Total Protein: 7.6 g/dL (ref 6.0–8.3)

## 2014-01-07 ENCOUNTER — Other Ambulatory Visit: Payer: Managed Care, Other (non HMO)

## 2014-01-11 ENCOUNTER — Telehealth: Payer: Self-pay | Admitting: Internal Medicine

## 2014-01-11 NOTE — Telephone Encounter (Signed)
Pt calling for lab results done 11/12. Please advise

## 2014-01-11 NOTE — Telephone Encounter (Signed)
Notify patient that his liver function studies are now near normal since discontinuation of atorvastatin;

## 2014-01-11 NOTE — Telephone Encounter (Signed)
Pt would results of labs °

## 2014-01-12 NOTE — Telephone Encounter (Signed)
Pt is calling back still waiting on results

## 2014-01-12 NOTE — Telephone Encounter (Signed)
Pt aware and verbalized understanding.  

## 2014-03-15 ENCOUNTER — Encounter: Payer: Self-pay | Admitting: Family Medicine

## 2014-03-15 ENCOUNTER — Ambulatory Visit (INDEPENDENT_AMBULATORY_CARE_PROVIDER_SITE_OTHER): Payer: BLUE CROSS/BLUE SHIELD | Admitting: Family Medicine

## 2014-03-15 VITALS — BP 151/86 | HR 72 | Temp 97.8°F

## 2014-03-15 DIAGNOSIS — M5442 Lumbago with sciatica, left side: Secondary | ICD-10-CM

## 2014-03-15 MED ORDER — KETOROLAC TROMETHAMINE 60 MG/2ML IM SOLN
60.0000 mg | Freq: Once | INTRAMUSCULAR | Status: AC
  Administered 2014-03-15: 60 mg via INTRAMUSCULAR

## 2014-03-15 MED ORDER — CYCLOBENZAPRINE HCL 10 MG PO TABS: 10.0000 mg | ORAL_TABLET | Freq: Three times a day (TID) | ORAL | Status: DC | PRN

## 2014-03-15 MED ORDER — PREDNISONE 10 MG PO TABS: ORAL_TABLET | ORAL | Status: DC

## 2014-03-15 MED ORDER — HYDROCODONE-ACETAMINOPHEN 10-325 MG PO TABS: 1.0000 | ORAL_TABLET | Freq: Four times a day (QID) | ORAL | Status: DC | PRN

## 2014-03-15 NOTE — Progress Notes (Signed)
Pre visit review using our clinic review tool, if applicable. No additional management support is needed unless otherwise documented below in the visit note. 

## 2014-03-15 NOTE — Addendum Note (Signed)
Addended by: Aniceto BossNIMMONS, Crystalynn Mcinerney A on: 03/15/2014 10:50 AM   Modules accepted: Orders

## 2014-03-15 NOTE — Progress Notes (Signed)
   Subjective:    Patient ID: Jesse Horne, male    DOB: 1961/02/03, 54 y.o.   MRN: [id number]  HPI Here for 2 days of severe lower back pain that radiates into the left buttock. He and his wife were lifting some heavy boxes of hardwood flooring over the weekend, and he woke up the next morning with pain. Using heat and Advil.    Review of Systems  Constitutional: Negative.   Musculoskeletal: Positive for back pain.       Objective:   Physical Exam  Constitutional:  In pain, limping   Musculoskeletal:  Tender over the lower back with a lot of spasm in the left paraspinal muscles. ROM is reduced. SLR are positive bilaterally           Assessment & Plan:  Rest , use heat. Given a Toradol shot and meds. He will return if not better by next week.

## 2014-03-24 ENCOUNTER — Telehealth: Payer: Self-pay | Admitting: Family Medicine

## 2014-03-24 NOTE — Telephone Encounter (Signed)
[facility] - Client TELEPHONE ADVICE RECORD [facility]  Patient Name: Jesse Horne  DOB: 08-Jun-1960    Initial Comment Caller states he is on Prednisone for back pain and he wants to know f he can just stop it.   Nurse Assessment      Guidelines    Guideline Title Affirmed Question Affirmed Notes       Final Disposition User   Clinical Call Odis LusterBowers, RN, Bjorn Loserhonda    Comments  Reports that he has been on a prednisone dose pak and today is his 10th day, he reports that he has a total of 6 days left. Wanting to stop the med due to the side effects. He reports that his back is much better. He reports that he is taking 1 pill per day beginning to tomorrow. Advised that due to nursing knowledge, the side effects of suddently stopping prednisone can be significant (blood sugar, return of back pain). Advised to continue the dose as prescribed for the full 16 days as ordered, but also advised that he may call back and speak with MD during business hours if he continues to want to dc early. Advised that the 16 day course will continue to reduce the inflammation in his back and this is most likely why his back pain is much improved. Caller voiced understanding.

## 2014-06-20 ENCOUNTER — Telehealth: Payer: Self-pay | Admitting: Internal Medicine

## 2014-06-20 DIAGNOSIS — E785 Hyperlipidemia, unspecified: Secondary | ICD-10-CM

## 2014-06-20 DIAGNOSIS — R748 Abnormal levels of other serum enzymes: Secondary | ICD-10-CM

## 2014-06-20 NOTE — Telephone Encounter (Signed)
Spoke to pt, told him Dr. Kirtland BouchardK is out of the office till next Monday. I will give him the message then and see if can order repeat labs and call you back next week. Pt verbalized understanding.

## 2014-06-20 NOTE — Telephone Encounter (Signed)
Yes, I know when his last physical was. Patient is asking to have his cholesterol checked because Dr. Kirtland BouchardK took him off his medication and was told to come back for a follow up.  Patient wants to have lab drawn before his appointment.

## 2014-06-20 NOTE — Telephone Encounter (Signed)
Patient wants to schedule a lab appointment to have his cholesterol checked.  Please advise if lab appointment is okay?

## 2014-06-20 NOTE — Telephone Encounter (Signed)
Jesse Horne, pt is not due for physical till Oct. Last Cholesterol was checked 11/2013 and was normal. Insurance will not pay cause it has not been a year.

## 2014-06-27 NOTE — Telephone Encounter (Signed)
Dr. Kirtland BouchardK, pt has been off Lipitor since Oct or Nov 2015 due to abnormal labs. Pt wants to know if can check labs again to see if can go back on medication. Please advise.

## 2014-06-28 NOTE — Telephone Encounter (Signed)
Spoke to pt, told him Dr. Kirtland BouchardK said okay to order labs will repeat Hepatic and Lipid panel, need to fast. Pt verbalized understanding. Told pt to schedule lab appt and follow up with Dr. Kirtland BouchardK . Pt verbalized understanding. Asked pt if he wants to schedule now I can transfer you? Pt said yes. Pt transferred. Orders put in EPIC.

## 2014-06-28 NOTE — Telephone Encounter (Signed)
Repeat hepatic and lipid profile

## 2014-10-04 ENCOUNTER — Other Ambulatory Visit (INDEPENDENT_AMBULATORY_CARE_PROVIDER_SITE_OTHER): Payer: BLUE CROSS/BLUE SHIELD

## 2014-10-04 DIAGNOSIS — E785 Hyperlipidemia, unspecified: Secondary | ICD-10-CM | POA: Diagnosis not present

## 2014-10-04 DIAGNOSIS — R748 Abnormal levels of other serum enzymes: Secondary | ICD-10-CM

## 2014-10-04 LAB — LIPID PANEL
Cholesterol: 259 mg/dL — ABNORMAL HIGH (ref 0–200)
HDL: 50.6 mg/dL (ref >39.00)
LDL Cholesterol: 177 mg/dL — ABNORMAL HIGH (ref 0–99)
NONHDL: 208.16
Total CHOL/HDL Ratio: 5
Triglycerides: 158 mg/dL — ABNORMAL HIGH (ref 0.0–149.0)
VLDL: 31.6 mg/dL (ref 0.0–40.0)

## 2014-10-04 LAB — HEPATIC FUNCTION PANEL
ALK PHOS: 33 U/L — AB (ref 39–117)
ALT: 25 U/L (ref 0–53)
AST: 20 U/L (ref 0–37)
Albumin: 3.8 g/dL (ref 3.5–5.2)
Bilirubin, Direct: 0.1 mg/dL (ref 0.0–0.3)
Total Bilirubin: 0.5 mg/dL (ref 0.2–1.2)
Total Protein: 6.7 g/dL (ref 6.0–8.3)

## 2014-10-12 ENCOUNTER — Ambulatory Visit (INDEPENDENT_AMBULATORY_CARE_PROVIDER_SITE_OTHER): Payer: BLUE CROSS/BLUE SHIELD | Admitting: Internal Medicine

## 2014-10-12 ENCOUNTER — Encounter: Payer: Self-pay | Admitting: Internal Medicine

## 2014-10-12 ENCOUNTER — Ambulatory Visit: Payer: BLUE CROSS/BLUE SHIELD | Admitting: Internal Medicine

## 2014-10-12 VITALS — BP 140/90 | HR 79 | Temp 97.7°F | Resp 20 | Ht 67.5 in | Wt 184.0 lb

## 2014-10-12 DIAGNOSIS — E785 Hyperlipidemia, unspecified: Secondary | ICD-10-CM | POA: Diagnosis not present

## 2014-10-12 MED ORDER — ATORVASTATIN CALCIUM 20 MG PO TABS: 20.0000 mg | ORAL_TABLET | Freq: Every day | ORAL | Status: DC

## 2014-10-12 NOTE — Patient Instructions (Signed)
Limit your sodium (Salt) intake    It is important that you exercise regularly, at least 20 minutes 3 to 4 times per week.  If you develop chest pain or shortness of breath seek  medical attention.  CPX as scheduled

## 2014-10-12 NOTE — Progress Notes (Signed)
Pre visit review using our clinic review tool, if applicable. No additional management support is needed unless otherwise documented below in the visit note. 

## 2014-10-12 NOTE — Progress Notes (Signed)
Subjective:    Patient ID: Jesse Horne, male    DOB: 1960-12-19, 54 y.o.   MRN: [id number]  HPI  54 year old patient who is seen today to discuss statin therapy.  He has a history of LFT elevation on statin therapy, which has been held.  Transaminases were less than 3 times upper limits of normal.  Follow-up LFTs are now normal off therapy LDL cholesterol 177 Father had coronary artery disease at age 5 but still living at 30.  Patient has no significant cardiac risk factors  Past Medical History  Diagnosis Date  . Hyperlipidemia   . Internal hemorrhoid, bleeding   . Seasonal and perennial allergic rhinitis   . Wears glasses   . At risk for sleep apnea     STOP-BANG= 4    SENT TO PCP 12-24-2013  . Family history of anesthesia complication     SISTER--  PONV    Social History   Social History  . Marital Status: Single    Spouse Name: N/A  . Number of Children: N/A  . Years of Education: N/A   Occupational History  . Not on file.   Social History Main Topics  . Smoking status: Never Smoker   . Smokeless tobacco: Never Used  . Alcohol Use: 1.2 oz/week    2 Standard drinks or equivalent per week  . Drug Use: No  . Sexual Activity: Not on file   Other Topics Concern  . Not on file   Social History Narrative    Past Surgical History  Procedure Laterality Date  . Colonoscopy N/A 07/29/2012    Procedure: COLONOSCOPY;  Surgeon: Romie Levee, MD;  Location: [facility];  Service: Endoscopy;  Laterality: N/A;   . Transthoracic echocardiogram  03-17-2008    mild focal septal hypertrophy/  ef 60%/  trivial TR  . Transanal hemorrhoidal dearterialization N/A 12/29/2013    Procedure: TRANSANAL HEMORRHOIDAL DEARTERIALIZATION;  Surgeon: Romie Levee, MD;  Location: [facility];  Service: General;  Laterality: N/A;    Family History  Problem Relation Age of Onset  . Cancer Mother     Melanoma, Basal Cell Carcinoma, Breast  . Heart disease Father   .  Cancer Sister     Breast  . Heart disease Sister     No Known Allergies  Current Outpatient Prescriptions on File Prior to Visit  Medication Sig Dispense Refill  . acetaminophen (TYLENOL) 500 MG tablet Take 500 mg by mouth every 6 (six) hours as needed.    . cyclobenzaprine (FLEXERIL) 10 MG tablet Take 1 tablet (10 mg total) by mouth 3 (three) times daily as needed for muscle spasms. 60 tablet 2  . EPIPEN 2-PAK 0.3 MG/0.3ML SOAJ as needed.     Marland Kitchen ibuprofen (ADVIL,MOTRIN) 200 MG tablet Take 200 mg by mouth every 6 (six) hours as needed.    Marland Kitchen levocetirizine (XYZAL) 5 MG tablet Take 5 mg by mouth every morning.     . mometasone (NASONEX) 50 MCG/ACT nasal spray Place 2 sprays into the nose as needed.    . Psyllium-Calcium (METAMUCIL PLUS CALCIUM) CAPS Take 8 capsules by mouth daily.    . vitamin C (ASCORBIC ACID) 500 MG tablet Take 500 mg by mouth daily.    . vitamin E 400 UNIT capsule Take 400 Units by mouth daily.    Marland Kitchen atorvastatin (LIPITOR) 80 MG tablet Take 1 tablet (80 mg total) by mouth daily. (Patient not taking: Reported on 10/12/2014) 90 tablet 6  No current facility-administered medications on file prior to visit.    BP 140/90 mmHg  Pulse 79  Temp(Src) 97.7 F (36.5 C) (Oral)  Resp 20  Ht 5' 7.5" (1.715 m)  Wt 184 lb (83.462 kg)  BMI 28.38 kg/m2  SpO2 98%     Review of Systems  Constitutional: Negative for fever, chills, appetite change and fatigue.  HENT: Negative for congestion, dental problem, ear pain, hearing loss, sore throat, tinnitus, trouble swallowing and voice change.   Eyes: Negative for pain, discharge and visual disturbance.  Respiratory: Negative for cough, chest tightness, wheezing and stridor.   Cardiovascular: Negative for chest pain, palpitations and leg swelling.  Gastrointestinal: Negative for nausea, vomiting, abdominal pain, diarrhea, constipation, blood in stool and abdominal distention.  Genitourinary: Negative for urgency, hematuria, flank  pain, discharge, difficulty urinating and genital sores.  Musculoskeletal: Negative for myalgias, back pain, joint swelling, arthralgias, gait problem and neck stiffness.  Skin: Negative for rash.  Neurological: Negative for dizziness, syncope, speech difficulty, weakness, numbness and headaches.  Hematological: Negative for adenopathy. Does not bruise/bleed easily.  Psychiatric/Behavioral: Negative for behavioral problems and dysphoric mood. The patient is not nervous/anxious.        Objective:   Physical Exam  Constitutional: He appears well-developed and well-nourished. No distress.  Blood pressure 140/90          Assessment & Plan:    Dyslipidemia.  Options discussed including no therapy.  Elected to resume low intensity statin therapy with atorvastatin 20 mg daily History of statin-induced elevated transaminases.  Elevations were minor on high intensity statin therapy  CPX as scheduled

## 2015-05-31 DIAGNOSIS — J301 Allergic rhinitis due to pollen: Secondary | ICD-10-CM | POA: Diagnosis not present

## 2015-05-31 DIAGNOSIS — J3081 Allergic rhinitis due to animal (cat) (dog) hair and dander: Secondary | ICD-10-CM | POA: Diagnosis not present

## 2015-05-31 DIAGNOSIS — J3089 Other allergic rhinitis: Secondary | ICD-10-CM | POA: Diagnosis not present

## 2015-06-13 DIAGNOSIS — J3081 Allergic rhinitis due to animal (cat) (dog) hair and dander: Secondary | ICD-10-CM | POA: Diagnosis not present

## 2015-06-13 DIAGNOSIS — J301 Allergic rhinitis due to pollen: Secondary | ICD-10-CM | POA: Diagnosis not present

## 2015-06-13 DIAGNOSIS — J3089 Other allergic rhinitis: Secondary | ICD-10-CM | POA: Diagnosis not present

## 2015-06-27 DIAGNOSIS — J301 Allergic rhinitis due to pollen: Secondary | ICD-10-CM | POA: Diagnosis not present

## 2015-06-27 DIAGNOSIS — J3081 Allergic rhinitis due to animal (cat) (dog) hair and dander: Secondary | ICD-10-CM | POA: Diagnosis not present

## 2015-06-27 DIAGNOSIS — J3089 Other allergic rhinitis: Secondary | ICD-10-CM | POA: Diagnosis not present

## 2015-07-04 DIAGNOSIS — J3089 Other allergic rhinitis: Secondary | ICD-10-CM | POA: Diagnosis not present

## 2015-07-04 DIAGNOSIS — J301 Allergic rhinitis due to pollen: Secondary | ICD-10-CM | POA: Diagnosis not present

## 2015-07-12 DIAGNOSIS — J3081 Allergic rhinitis due to animal (cat) (dog) hair and dander: Secondary | ICD-10-CM | POA: Diagnosis not present

## 2015-07-12 DIAGNOSIS — J301 Allergic rhinitis due to pollen: Secondary | ICD-10-CM | POA: Diagnosis not present

## 2015-07-12 DIAGNOSIS — J3089 Other allergic rhinitis: Secondary | ICD-10-CM | POA: Diagnosis not present

## 2015-07-21 DIAGNOSIS — T783XXD Angioneurotic edema, subsequent encounter: Secondary | ICD-10-CM | POA: Diagnosis not present

## 2015-07-21 DIAGNOSIS — J3089 Other allergic rhinitis: Secondary | ICD-10-CM | POA: Diagnosis not present

## 2015-07-21 DIAGNOSIS — J3081 Allergic rhinitis due to animal (cat) (dog) hair and dander: Secondary | ICD-10-CM | POA: Diagnosis not present

## 2015-07-21 DIAGNOSIS — J301 Allergic rhinitis due to pollen: Secondary | ICD-10-CM | POA: Diagnosis not present

## 2015-07-27 DIAGNOSIS — J3089 Other allergic rhinitis: Secondary | ICD-10-CM | POA: Diagnosis not present

## 2015-07-27 DIAGNOSIS — J3081 Allergic rhinitis due to animal (cat) (dog) hair and dander: Secondary | ICD-10-CM | POA: Diagnosis not present

## 2015-07-27 DIAGNOSIS — J301 Allergic rhinitis due to pollen: Secondary | ICD-10-CM | POA: Diagnosis not present

## 2015-08-03 DIAGNOSIS — J3089 Other allergic rhinitis: Secondary | ICD-10-CM | POA: Diagnosis not present

## 2015-08-03 DIAGNOSIS — J301 Allergic rhinitis due to pollen: Secondary | ICD-10-CM | POA: Diagnosis not present

## 2015-08-03 DIAGNOSIS — J3081 Allergic rhinitis due to animal (cat) (dog) hair and dander: Secondary | ICD-10-CM | POA: Diagnosis not present

## 2015-08-04 ENCOUNTER — Encounter: Payer: Self-pay | Admitting: Internal Medicine

## 2015-08-04 ENCOUNTER — Ambulatory Visit (INDEPENDENT_AMBULATORY_CARE_PROVIDER_SITE_OTHER): Payer: BLUE CROSS/BLUE SHIELD | Admitting: Internal Medicine

## 2015-08-04 VITALS — BP 144/98 | HR 85 | Temp 98.8°F | Ht 67.5 in | Wt 183.0 lb

## 2015-08-04 DIAGNOSIS — J029 Acute pharyngitis, unspecified: Secondary | ICD-10-CM | POA: Diagnosis not present

## 2015-08-04 DIAGNOSIS — J3089 Other allergic rhinitis: Secondary | ICD-10-CM | POA: Diagnosis not present

## 2015-08-04 LAB — POCT RAPID STREP A (OFFICE): Rapid Strep A Screen: NEGATIVE

## 2015-08-04 NOTE — Patient Instructions (Addendum)
Limit your sodium (Salt) intake  Please check your blood pressure on a regular basis.  If it is consistently greater than 140/90, please make an office appointment.    DASH Eating Plan DASH stands for "Dietary Approaches to Stop Hypertension." The DASH eating plan is a healthy eating plan that has been shown to reduce high blood pressure (hypertension). Additional health benefits may include reducing the risk of type 2 diabetes mellitus, heart disease, and stroke. The DASH eating plan may also help with weight loss. WHAT DO I NEED TO KNOW ABOUT THE DASH EATING PLAN? For the DASH eating plan, you will follow these general guidelines:  Choose foods with a percent daily value for sodium of less than 5% (as listed on the food label).  Use salt-free seasonings or herbs instead of table salt or sea salt.  Check with your health care provider or pharmacist before using salt substitutes.  Eat lower-sodium products, often labeled as "lower sodium" or "no salt added."  Eat fresh foods.  Eat more vegetables, fruits, and low-fat dairy products.  Choose whole grains. Look for the word "whole" as the first word in the ingredient list.  Choose fish and skinless chicken or [place] more often than red meat. Limit fish, poultry, and meat to 6 oz (170 g) each day.  Limit sweets, desserts, sugars, and sugary drinks.  Choose heart-healthy fats.  Limit cheese to 1 oz (28 g) per day.  Eat more home-cooked food and less restaurant, buffet, and fast food.  Limit fried foods.  Cook foods using methods other than frying.  Limit canned vegetables. If you do use them, rinse them well to decrease the sodium.  When eating at a restaurant, ask that your food be prepared with less salt, or no salt if possible. WHAT FOODS CAN I EAT? Seek help from a dietitian for individual calorie needs. Grains Whole grain or whole wheat bread. Brown rice. Whole grain or whole wheat pasta. Quinoa, bulgur, and whole grain  cereals. Low-sodium cereals. Corn or whole wheat flour tortillas. Whole grain cornbread. Whole grain crackers. Low-sodium crackers. Vegetables Fresh or frozen vegetables (raw, steamed, roasted, or grilled). Low-sodium or reduced-sodium tomato and vegetable juices. Low-sodium or reduced-sodium tomato sauce and paste. Low-sodium or reduced-sodium canned vegetables.  Fruits All fresh, canned (in natural juice), or frozen fruits. Meat and Other Protein Products Ground beef (85% or leaner), grass-fed beef, or beef trimmed of fat. Skinless chicken or [place]. Ground chicken or [place]. Pork trimmed of fat. All fish and seafood. Eggs. Dried beans, peas, or lentils. Unsalted nuts and seeds. Unsalted canned beans. Dairy Low-fat dairy products, such as skim or 1% milk, 2% or reduced-fat cheeses, low-fat ricotta or cottage cheese, or plain low-fat yogurt. Low-sodium or reduced-sodium cheeses. Fats and Oils Tub margarines without trans fats. Light or reduced-fat mayonnaise and salad dressings (reduced sodium). Avocado. Safflower, olive, or canola oils. Natural peanut or almond butter. Other Unsalted popcorn and pretzels. The items listed above may not be a complete list of recommended foods or beverages. Contact your dietitian for more options. WHAT FOODS ARE NOT RECOMMENDED? Grains White bread. White pasta. White rice. Refined cornbread. Bagels and croissants. Crackers that contain trans fat. Vegetables Creamed or fried vegetables. Vegetables in a cheese sauce. Regular canned vegetables. Regular canned tomato sauce and paste. Regular tomato and vegetable juices. Fruits Dried fruits. Canned fruit in light or heavy syrup. Fruit juice. Meat and Other Protein Products Fatty cuts of meat. Ribs, chicken wings, bacon, sausage, bologna, salami, chitterlings,  fatback, hot dogs, bratwurst, and packaged luncheon meats. Salted nuts and seeds. Canned beans with salt. Dairy Whole or 2% milk, cream, half-and-half, and  cream cheese. Whole-fat or sweetened yogurt. Full-fat cheeses or blue cheese. Nondairy creamers and whipped toppings. Processed cheese, cheese spreads, or cheese curds. Condiments Onion and garlic salt, seasoned salt, table salt, and sea salt. Canned and packaged gravies. Worcestershire sauce. Tartar sauce. Barbecue sauce. Teriyaki sauce. Soy sauce, including reduced sodium. Steak sauce. Fish sauce. Oyster sauce. Cocktail sauce. Horseradish. Ketchup and mustard. Meat flavorings and tenderizers. Bouillon cubes. Hot sauce. Tabasco sauce. Marinades. Taco seasonings. Relishes. Fats and Oils Butter, stick margarine, lard, shortening, ghee, and bacon fat. Coconut, palm kernel, or palm oils. Regular salad dressings. Other Pickles and olives. Salted popcorn and pretzels. The items listed above may not be a complete list of foods and beverages to avoid. Contact your dietitian for more information. WHERE CAN I FIND MORE INFORMATION? National Heart, Lung, and Blood Institute: travelstabloid.com   This information is not intended to replace advice given to you by your health care provider. Make sure you discuss any questions you have with your health care provider.   Document Released: 01/31/2011 Document Revised: 03/04/2014 Document Reviewed: 12/16/2012 Elsevier Interactive Patient Education [employer].

## 2015-08-04 NOTE — Progress Notes (Signed)
Subjective:    Patient ID: Jesse Horne, male    DOB: 07/07/1960, 55 y.o.   MRN: [id number]  HPI  55 year old patient who has allergic rhinitis.  He presents with a two-week history of cough that has largely resolved.  More recently he has had several day history of persistent sore throat.  Is concerned about possible strep.  Denies any fever or cervical adenopathy.  No strep exposure.  BP Readings from Last 3 Encounters:  08/04/15 144/98  10/12/14 140/90  03/15/14 151/86    Past Medical History  Diagnosis Date  . Hyperlipidemia   . Internal hemorrhoid, bleeding   . Seasonal and perennial allergic rhinitis   . Wears glasses   . At risk for sleep apnea     STOP-BANG= 4    SENT TO PCP 12-24-2013  . Family history of anesthesia complication     SISTER--  PONV     Social History   Social History  . Marital Status: Single    Spouse Name: N/A  . Number of Children: N/A  . Years of Education: N/A   Occupational History  . Not on file.   Social History Main Topics  . Smoking status: Never Smoker   . Smokeless tobacco: Never Used  . Alcohol Use: 1.2 oz/week    2 Standard drinks or equivalent per week  . Drug Use: No  . Sexual Activity: Not on file   Other Topics Concern  . Not on file   Social History Narrative    Past Surgical History  Procedure Laterality Date  . Colonoscopy N/A 07/29/2012    Procedure: COLONOSCOPY;  Surgeon: Romie Levee, MD;  Location: [facility];  Service: Endoscopy;  Laterality: N/A;   . Transthoracic echocardiogram  03-17-2008    mild focal septal hypertrophy/  ef 60%/  trivial TR  . Transanal hemorrhoidal dearterialization N/A 12/29/2013    Procedure: TRANSANAL HEMORRHOIDAL DEARTERIALIZATION;  Surgeon: Romie Levee, MD;  Location: [facility];  Service: General;  Laterality: N/A;    Family History  Problem Relation Age of Onset  . Cancer Mother     Melanoma, Basal Cell Carcinoma, Breast  . Heart disease Father   .  Cancer Sister     Breast  . Heart disease Sister     No Known Allergies  Current Outpatient Prescriptions on File Prior to Visit  Medication Sig Dispense Refill  . atorvastatin (LIPITOR) 20 MG tablet Take 1 tablet (20 mg total) by mouth daily. 90 tablet 5  . EPIPEN 2-PAK 0.3 MG/0.3ML SOAJ as needed.     Marland Kitchen levocetirizine (XYZAL) 5 MG tablet Take 5 mg by mouth every morning.     . mometasone (NASONEX) 50 MCG/ACT nasal spray Place 2 sprays into the nose as needed.    . Psyllium-Calcium (METAMUCIL PLUS CALCIUM) CAPS Take 8 capsules by mouth daily.    . vitamin E 400 UNIT capsule Take 400 Units by mouth daily.     No current facility-administered medications on file prior to visit.    BP 144/98 mmHg  Pulse 85  Temp(Src) 98.8 F (37.1 C) (Oral)  Ht 5' 7.5" (1.715 m)  Wt 183 lb (83.008 kg)  BMI 28.22 kg/m2  SpO2 98%     Review of Systems  Constitutional: Negative for fever, chills, appetite change and fatigue.  HENT: Positive for postnasal drip and sore throat. Negative for congestion, dental problem, ear pain, hearing loss, tinnitus, trouble swallowing and voice change.   Eyes: Negative for  pain, discharge and visual disturbance.  Respiratory: Positive for cough. Negative for chest tightness, wheezing and stridor.   Cardiovascular: Negative for chest pain, palpitations and leg swelling.  Gastrointestinal: Negative for nausea, vomiting, abdominal pain, diarrhea, constipation, blood in stool and abdominal distention.  Genitourinary: Negative for urgency, hematuria, flank pain, discharge, difficulty urinating and genital sores.  Musculoskeletal: Negative for myalgias, back pain, joint swelling, arthralgias, gait problem and neck stiffness.  Skin: Negative for rash.  Neurological: Negative for dizziness, syncope, speech difficulty, weakness, numbness and headaches.  Hematological: Negative for adenopathy. Does not bruise/bleed easily.  Psychiatric/Behavioral: Negative for behavioral  problems and dysphoric mood. The patient is not nervous/anxious.        Objective:   Physical Exam  Constitutional: He is oriented to person, place, and time. He appears well-developed.  Oropharynx is erythematous but no exudate Blood pressure on arrival 140 over 98 Repeat blood pressure 140/85  HENT:  Head: Normocephalic.  Right Ear: External ear normal.  Left Ear: External ear normal.  Eyes: Conjunctivae and EOM are normal.  Neck: Normal range of motion.  Cardiovascular: Normal rate and normal heart sounds.   Pulmonary/Chest: Breath sounds normal. No respiratory distress. He has no wheezes. He has no rales.  Abdominal: Bowel sounds are normal.  Musculoskeletal: Normal range of motion. He exhibits no edema or tenderness.  Lymphadenopathy:    He has no cervical adenopathy.  Neurological: He is alert and oriented to person, place, and time.  Psychiatric: He has a normal mood and affect. His behavior is normal.          Assessment & Plan:   Jesse Horne.  ViralURI with pharyngitis and cough.  Will screen for group A strep.  Treat symptomatically .  Rule out hypertension.  Will place on a low-salt diet.  Home blood pressure monitoring.  Encouraged.  Will call the office if blood pressure readings are consistently above 140 over 90  Rogelia BogaKWIATKOWSKI,Ladarius Seubert FRANK, MD

## 2015-08-04 NOTE — Progress Notes (Signed)
Pre visit review using our clinic review tool, if applicable. No additional management support is needed unless otherwise documented below in the visit note. 

## 2015-08-09 DIAGNOSIS — J3081 Allergic rhinitis due to animal (cat) (dog) hair and dander: Secondary | ICD-10-CM | POA: Diagnosis not present

## 2015-08-09 DIAGNOSIS — J301 Allergic rhinitis due to pollen: Secondary | ICD-10-CM | POA: Diagnosis not present

## 2015-08-09 DIAGNOSIS — J3089 Other allergic rhinitis: Secondary | ICD-10-CM | POA: Diagnosis not present

## 2015-08-16 DIAGNOSIS — J301 Allergic rhinitis due to pollen: Secondary | ICD-10-CM | POA: Diagnosis not present

## 2015-08-16 DIAGNOSIS — J3089 Other allergic rhinitis: Secondary | ICD-10-CM | POA: Diagnosis not present

## 2015-08-16 DIAGNOSIS — J3081 Allergic rhinitis due to animal (cat) (dog) hair and dander: Secondary | ICD-10-CM | POA: Diagnosis not present

## 2015-08-18 DIAGNOSIS — J301 Allergic rhinitis due to pollen: Secondary | ICD-10-CM | POA: Diagnosis not present

## 2015-08-18 DIAGNOSIS — J3081 Allergic rhinitis due to animal (cat) (dog) hair and dander: Secondary | ICD-10-CM | POA: Diagnosis not present

## 2015-08-21 DIAGNOSIS — J3089 Other allergic rhinitis: Secondary | ICD-10-CM | POA: Diagnosis not present

## 2015-08-21 DIAGNOSIS — J3081 Allergic rhinitis due to animal (cat) (dog) hair and dander: Secondary | ICD-10-CM | POA: Diagnosis not present

## 2015-08-24 DIAGNOSIS — J3089 Other allergic rhinitis: Secondary | ICD-10-CM | POA: Diagnosis not present

## 2015-08-24 DIAGNOSIS — J3081 Allergic rhinitis due to animal (cat) (dog) hair and dander: Secondary | ICD-10-CM | POA: Diagnosis not present

## 2015-08-24 DIAGNOSIS — J301 Allergic rhinitis due to pollen: Secondary | ICD-10-CM | POA: Diagnosis not present

## 2015-08-31 DIAGNOSIS — J3081 Allergic rhinitis due to animal (cat) (dog) hair and dander: Secondary | ICD-10-CM | POA: Diagnosis not present

## 2015-08-31 DIAGNOSIS — J3089 Other allergic rhinitis: Secondary | ICD-10-CM | POA: Diagnosis not present

## 2015-08-31 DIAGNOSIS — J301 Allergic rhinitis due to pollen: Secondary | ICD-10-CM | POA: Diagnosis not present

## 2015-09-06 DIAGNOSIS — J3089 Other allergic rhinitis: Secondary | ICD-10-CM | POA: Diagnosis not present

## 2015-09-06 DIAGNOSIS — J301 Allergic rhinitis due to pollen: Secondary | ICD-10-CM | POA: Diagnosis not present

## 2015-09-06 DIAGNOSIS — J3081 Allergic rhinitis due to animal (cat) (dog) hair and dander: Secondary | ICD-10-CM | POA: Diagnosis not present

## 2015-09-11 DIAGNOSIS — J3081 Allergic rhinitis due to animal (cat) (dog) hair and dander: Secondary | ICD-10-CM | POA: Diagnosis not present

## 2015-09-11 DIAGNOSIS — J3089 Other allergic rhinitis: Secondary | ICD-10-CM | POA: Diagnosis not present

## 2015-09-11 DIAGNOSIS — J301 Allergic rhinitis due to pollen: Secondary | ICD-10-CM | POA: Diagnosis not present

## 2015-09-21 DIAGNOSIS — J3081 Allergic rhinitis due to animal (cat) (dog) hair and dander: Secondary | ICD-10-CM | POA: Diagnosis not present

## 2015-09-21 DIAGNOSIS — J3089 Other allergic rhinitis: Secondary | ICD-10-CM | POA: Diagnosis not present

## 2015-09-21 DIAGNOSIS — J301 Allergic rhinitis due to pollen: Secondary | ICD-10-CM | POA: Diagnosis not present

## 2015-09-27 DIAGNOSIS — J301 Allergic rhinitis due to pollen: Secondary | ICD-10-CM | POA: Diagnosis not present

## 2015-09-27 DIAGNOSIS — J3089 Other allergic rhinitis: Secondary | ICD-10-CM | POA: Diagnosis not present

## 2015-09-27 DIAGNOSIS — J3081 Allergic rhinitis due to animal (cat) (dog) hair and dander: Secondary | ICD-10-CM | POA: Diagnosis not present

## 2015-09-29 DIAGNOSIS — J3089 Other allergic rhinitis: Secondary | ICD-10-CM | POA: Diagnosis not present

## 2015-09-29 DIAGNOSIS — J301 Allergic rhinitis due to pollen: Secondary | ICD-10-CM | POA: Diagnosis not present

## 2015-09-29 DIAGNOSIS — J3081 Allergic rhinitis due to animal (cat) (dog) hair and dander: Secondary | ICD-10-CM | POA: Diagnosis not present

## 2015-10-03 DIAGNOSIS — J301 Allergic rhinitis due to pollen: Secondary | ICD-10-CM | POA: Diagnosis not present

## 2015-10-03 DIAGNOSIS — J3089 Other allergic rhinitis: Secondary | ICD-10-CM | POA: Diagnosis not present

## 2015-10-03 DIAGNOSIS — J3081 Allergic rhinitis due to animal (cat) (dog) hair and dander: Secondary | ICD-10-CM | POA: Diagnosis not present

## 2015-10-05 DIAGNOSIS — J3089 Other allergic rhinitis: Secondary | ICD-10-CM | POA: Diagnosis not present

## 2015-10-05 DIAGNOSIS — J3081 Allergic rhinitis due to animal (cat) (dog) hair and dander: Secondary | ICD-10-CM | POA: Diagnosis not present

## 2015-10-05 DIAGNOSIS — J301 Allergic rhinitis due to pollen: Secondary | ICD-10-CM | POA: Diagnosis not present

## 2015-10-10 DIAGNOSIS — J3081 Allergic rhinitis due to animal (cat) (dog) hair and dander: Secondary | ICD-10-CM | POA: Diagnosis not present

## 2015-10-10 DIAGNOSIS — J301 Allergic rhinitis due to pollen: Secondary | ICD-10-CM | POA: Diagnosis not present

## 2015-10-10 DIAGNOSIS — J3089 Other allergic rhinitis: Secondary | ICD-10-CM | POA: Diagnosis not present

## 2015-10-16 ENCOUNTER — Encounter: Payer: Self-pay | Admitting: Family Medicine

## 2015-10-16 ENCOUNTER — Ambulatory Visit (INDEPENDENT_AMBULATORY_CARE_PROVIDER_SITE_OTHER): Payer: BLUE CROSS/BLUE SHIELD | Admitting: Family Medicine

## 2015-10-16 VITALS — BP 136/88 | HR 87 | Temp 98.0°F | Wt 179.4 lb

## 2015-10-16 DIAGNOSIS — T148 Other injury of unspecified body region: Secondary | ICD-10-CM | POA: Diagnosis not present

## 2015-10-16 DIAGNOSIS — L03211 Cellulitis of face: Secondary | ICD-10-CM

## 2015-10-16 DIAGNOSIS — W540XXA Bitten by dog, initial encounter: Secondary | ICD-10-CM

## 2015-10-16 MED ORDER — AMOXICILLIN-POT CLAVULANATE 875-125 MG PO TABS: 1.0000 | ORAL_TABLET | Freq: Two times a day (BID) | ORAL | 0 refills | Status: DC

## 2015-10-16 NOTE — Progress Notes (Signed)
Pre visit review using our clinic review tool, if applicable. No additional management support is needed unless otherwise documented below in the visit note. 

## 2015-10-16 NOTE — Progress Notes (Signed)
HPI:  Acute visit for:  Dog bite: -bit or scratch by [place] 2 [place] -per pt pet has all shots, is utd on rabies shots and is an indoor contained pet whom is not ill -pet bit or scratched him on the R upper outer lip - he thought bit him, but is not sure -he washed it well at the time, wound was superficial per his reports so he did not seek medical care -would was a little irritated and swollen yesterday so washed with peroxide -today improved, but still with mild swelling  ROS: See pertinent positives and negatives per HPI.  Past Medical History:  Diagnosis Date  . At risk for sleep apnea    STOP-BANG= 4    SENT TO PCP 12-24-2013  . Family history of anesthesia complication    SISTER--  PONV  . Hyperlipidemia   . Internal hemorrhoid, bleeding   . Seasonal and perennial allergic rhinitis   . Wears glasses     Past Surgical History:  Procedure Laterality Date  . COLONOSCOPY N/A 07/29/2012   Procedure: COLONOSCOPY;  Surgeon: Romie LeveeAlicia Thomas, MD;  Location: [facility];  Service: Endoscopy;  Laterality: N/A;   . TRANSANAL HEMORRHOIDAL DEARTERIALIZATION N/A 12/29/2013   Procedure: TRANSANAL HEMORRHOIDAL DEARTERIALIZATION;  Surgeon: Romie LeveeAlicia Thomas, MD;  [facility];  Service: General;  Laterality: N/A;  . TRANSTHORACIC ECHOCARDIOGRAM  03-17-2008   mild focal septal hypertrophy/  ef 60%/  trivial TR    Family History  Problem Relation Age of Onset  . Cancer Mother     Melanoma, Basal Cell Carcinoma, Breast  . Heart disease Father   . Cancer Sister     Breast  . Heart disease Sister     Social History   Social History  . Marital status: Single    Spouse name: N/A  . Number of children: N/A  . Years of education: N/A   Social History Main Topics  . Smoking status: Never Smoker  . Smokeless tobacco: Never Used  . Alcohol use 1.2 oz/week    2 Standard drinks or equivalent per week  . Drug use: No  . Sexual activity: Not  Asked   Other Topics Concern  . None   Social History Narrative  . None     Current Outpatient Prescriptions:  .  atorvastatin (LIPITOR) 20 MG tablet, Take 1 tablet (20 mg total) by mouth daily., Disp: 90 tablet, Rfl: 5 .  azelastine (ASTELIN) 0.1 % nasal spray, Place 1 spray into both nostrils 2 (two) times daily., Disp: , Rfl: 3 .  EPIPEN 2-PAK 0.3 MG/0.3ML SOAJ, as needed. , Disp: , Rfl:  .  levocetirizine (XYZAL) 5 MG tablet, Take 5 mg by mouth every morning. , Disp: , Rfl:  .  mometasone (NASONEX) 50 MCG/ACT nasal spray, Place 2 sprays into the nose as needed., Disp: , Rfl:  .  Psyllium-Calcium (METAMUCIL PLUS CALCIUM) CAPS, Take 8 capsules by mouth daily., Disp: , Rfl:  .  vitamin E 400 UNIT capsule, Take 400 Units by mouth daily., Disp: , Rfl:  .  amoxicillin-clavulanate (AUGMENTIN) 875-125 MG tablet, Take 1 tablet by mouth 2 (two) times daily., Disp: 14 tablet, Rfl: 0  EXAM:  Vitals:   10/16/15 1459  BP: 136/88  Pulse: 87  Temp: 98 F (36.7 C)    Body mass index is 27.68 kg/m.  GENERAL: vitals reviewed and listed above, alert, oriented, appears well hydrated and in no acute distress  HEENT:  atraumatic, conjunttiva clear, no obvious abnormalities on inspection of external nose and ears  NECK: no obvious masses on inspection  SKIN: 4 small parallel linear excoriations R upper outer lip with mild erythema and induration of this area, no drainage or fluctuance, no sign of injury on inner lip  MS: moves all extremities without noticeable abnormality  PSYCH: pleasant and cooperative, no obvious depression or anxiety  ASSESSMENT AND PLAN:  Discussed the following assessment and plan:  Dog bite  Cellulitis of face  -? Bite or more likely scratch by domestic contained [place] -tx with augmentin, f/u in 2 days -Patient advised to return or notify a doctor immediately if symptoms worsen or persist or new concerns arise.  Patient Instructions   BEFORE YOU LEAVE: -follow up: in 2 days  Start the antibiotic today  Seek care sooner if worsening, spreading, fevers or other concerns.  Ensure dog is contained and observed for 10 day. Ensure prompt evaluation of pet if any signs of illness or unusual behavior.     Kriste BasqueKIM, Rajni Holsworth R., DO

## 2015-10-16 NOTE — Patient Instructions (Signed)
BEFORE YOU LEAVE: -follow up: in 2 days  Start the antibiotic today  Seek care sooner if worsening, spreading, fevers or other concerns.  Ensure dog is contained and observed for 10 day. Ensure prompt evaluation of pet if any signs of illness or unusual behavior.

## 2015-10-18 ENCOUNTER — Encounter: Payer: Self-pay | Admitting: Adult Health

## 2015-10-18 ENCOUNTER — Ambulatory Visit (INDEPENDENT_AMBULATORY_CARE_PROVIDER_SITE_OTHER): Payer: BLUE CROSS/BLUE SHIELD | Admitting: Adult Health

## 2015-10-18 VITALS — BP 160/90 | Temp 98.2°F | Ht 67.5 in | Wt 180.0 lb

## 2015-10-18 DIAGNOSIS — Z5189 Encounter for other specified aftercare: Secondary | ICD-10-CM | POA: Diagnosis not present

## 2015-10-18 DIAGNOSIS — J3081 Allergic rhinitis due to animal (cat) (dog) hair and dander: Secondary | ICD-10-CM | POA: Diagnosis not present

## 2015-10-18 DIAGNOSIS — J301 Allergic rhinitis due to pollen: Secondary | ICD-10-CM | POA: Diagnosis not present

## 2015-10-18 DIAGNOSIS — J3089 Other allergic rhinitis: Secondary | ICD-10-CM | POA: Diagnosis not present

## 2015-10-18 NOTE — Progress Notes (Signed)
   Subjective:    Patient ID: Jesse Horne, male    DOB: [birth year], 55 y.o.   MRN: [id number]  HPI  55 year old male, who saw Dr. Selena BattenKim two days ago for questionable dog bite; more likely scratch to right upper lip . The dog was UTD on all vaccinations. He was prescribed Augmentin and asked to follow up in 2 days.   Today in the office he reports that his wound appears well healing. He denies any fevers, drainage or swelling. There has not been any redness or warmth from the wound.   He has tolerated Augmentin well.   Review of Systems  Constitutional: Negative.   Skin: Positive for wound. Negative for color change.  All other systems reviewed and are negative.      Objective:   Physical Exam  Constitutional: He is oriented to person, place, and time. He appears well-developed and well-nourished. No distress.  Neurological: He is alert and oriented to person, place, and time.  Skin: Skin is warm and dry. He is not diaphoretic.  4 small parallel linear excoriations R upper outer lip. No redness, warmth or swelling. No signs of abscess  Psychiatric: He has a normal mood and affect. His behavior is normal. Judgment and thought content normal.  Vitals reviewed.     Assessment & Plan:  1. Encounter for wound care - Appears well healing - Continue with current wound care - Follow up if any changes.   Shirline Freesory Aayat Hajjar, NP

## 2015-10-25 DIAGNOSIS — J3089 Other allergic rhinitis: Secondary | ICD-10-CM | POA: Diagnosis not present

## 2015-10-25 DIAGNOSIS — J301 Allergic rhinitis due to pollen: Secondary | ICD-10-CM | POA: Diagnosis not present

## 2015-10-25 DIAGNOSIS — J3081 Allergic rhinitis due to animal (cat) (dog) hair and dander: Secondary | ICD-10-CM | POA: Diagnosis not present

## 2015-10-31 DIAGNOSIS — D485 Neoplasm of uncertain behavior of skin: Secondary | ICD-10-CM | POA: Diagnosis not present

## 2015-10-31 DIAGNOSIS — L821 Other seborrheic keratosis: Secondary | ICD-10-CM | POA: Diagnosis not present

## 2015-10-31 DIAGNOSIS — L814 Other melanin hyperpigmentation: Secondary | ICD-10-CM | POA: Diagnosis not present

## 2015-10-31 DIAGNOSIS — L650 Telogen effluvium: Secondary | ICD-10-CM | POA: Diagnosis not present

## 2015-10-31 DIAGNOSIS — D1801 Hemangioma of skin and subcutaneous tissue: Secondary | ICD-10-CM | POA: Diagnosis not present

## 2015-11-02 DIAGNOSIS — J301 Allergic rhinitis due to pollen: Secondary | ICD-10-CM | POA: Diagnosis not present

## 2015-11-02 DIAGNOSIS — J3081 Allergic rhinitis due to animal (cat) (dog) hair and dander: Secondary | ICD-10-CM | POA: Diagnosis not present

## 2015-11-02 DIAGNOSIS — J3089 Other allergic rhinitis: Secondary | ICD-10-CM | POA: Diagnosis not present

## 2015-11-03 DIAGNOSIS — Z79899 Other long term (current) drug therapy: Secondary | ICD-10-CM | POA: Diagnosis not present

## 2015-11-03 DIAGNOSIS — L659 Nonscarring hair loss, unspecified: Secondary | ICD-10-CM | POA: Diagnosis not present

## 2015-11-03 LAB — CBC AND DIFFERENTIAL
HEMATOCRIT: 43 % (ref 41–53)
Hemoglobin: 15.2 g/dL (ref 13.5–17.5)
Neutrophils Absolute: 4 /uL
Platelets: 283 10*3/uL (ref 150–399)
WBC: 6 10^3/mL

## 2015-11-03 LAB — TSH: TSH: 1.28 u[IU]/mL (ref 0.41–5.90)

## 2015-11-03 LAB — BASIC METABOLIC PANEL
BUN: 13 mg/dL (ref 4–21)
Creatinine: 1 mg/dL (ref 0.6–1.3)
GLUCOSE: 103 mg/dL
POTASSIUM: 4.1 mmol/L (ref 3.4–5.3)
SODIUM: 143 mmol/L (ref 137–147)

## 2015-11-03 LAB — HEPATIC FUNCTION PANEL
ALT: 39 U/L (ref 10–40)
AST: 44 U/L — AB (ref 14–40)
Alkaline Phosphatase: 52 U/L (ref 25–125)
Bilirubin, Total: 0.6 mg/dL

## 2015-11-08 DIAGNOSIS — J301 Allergic rhinitis due to pollen: Secondary | ICD-10-CM | POA: Diagnosis not present

## 2015-11-08 DIAGNOSIS — J3081 Allergic rhinitis due to animal (cat) (dog) hair and dander: Secondary | ICD-10-CM | POA: Diagnosis not present

## 2015-11-08 DIAGNOSIS — J3089 Other allergic rhinitis: Secondary | ICD-10-CM | POA: Diagnosis not present

## 2015-11-15 DIAGNOSIS — J3089 Other allergic rhinitis: Secondary | ICD-10-CM | POA: Diagnosis not present

## 2015-11-15 DIAGNOSIS — J3081 Allergic rhinitis due to animal (cat) (dog) hair and dander: Secondary | ICD-10-CM | POA: Diagnosis not present

## 2015-11-15 DIAGNOSIS — J301 Allergic rhinitis due to pollen: Secondary | ICD-10-CM | POA: Diagnosis not present

## 2015-11-20 ENCOUNTER — Other Ambulatory Visit: Payer: Self-pay | Admitting: Internal Medicine

## 2015-11-21 ENCOUNTER — Other Ambulatory Visit: Payer: Self-pay | Admitting: Internal Medicine

## 2015-11-23 ENCOUNTER — Encounter: Payer: Self-pay | Admitting: Internal Medicine

## 2015-11-24 DIAGNOSIS — J3089 Other allergic rhinitis: Secondary | ICD-10-CM | POA: Diagnosis not present

## 2015-11-24 DIAGNOSIS — J301 Allergic rhinitis due to pollen: Secondary | ICD-10-CM | POA: Diagnosis not present

## 2015-11-24 DIAGNOSIS — J3081 Allergic rhinitis due to animal (cat) (dog) hair and dander: Secondary | ICD-10-CM | POA: Diagnosis not present

## 2015-11-29 DIAGNOSIS — J301 Allergic rhinitis due to pollen: Secondary | ICD-10-CM | POA: Diagnosis not present

## 2015-11-29 DIAGNOSIS — J3089 Other allergic rhinitis: Secondary | ICD-10-CM | POA: Diagnosis not present

## 2015-11-29 DIAGNOSIS — J3081 Allergic rhinitis due to animal (cat) (dog) hair and dander: Secondary | ICD-10-CM | POA: Diagnosis not present

## 2015-12-06 DIAGNOSIS — J3081 Allergic rhinitis due to animal (cat) (dog) hair and dander: Secondary | ICD-10-CM | POA: Diagnosis not present

## 2015-12-06 DIAGNOSIS — J3089 Other allergic rhinitis: Secondary | ICD-10-CM | POA: Diagnosis not present

## 2015-12-06 DIAGNOSIS — J301 Allergic rhinitis due to pollen: Secondary | ICD-10-CM | POA: Diagnosis not present

## 2015-12-12 DIAGNOSIS — D485 Neoplasm of uncertain behavior of skin: Secondary | ICD-10-CM | POA: Diagnosis not present

## 2015-12-12 DIAGNOSIS — L905 Scar conditions and fibrosis of skin: Secondary | ICD-10-CM | POA: Diagnosis not present

## 2015-12-28 DIAGNOSIS — J301 Allergic rhinitis due to pollen: Secondary | ICD-10-CM | POA: Diagnosis not present

## 2015-12-28 DIAGNOSIS — J3089 Other allergic rhinitis: Secondary | ICD-10-CM | POA: Diagnosis not present

## 2015-12-28 DIAGNOSIS — J3081 Allergic rhinitis due to animal (cat) (dog) hair and dander: Secondary | ICD-10-CM | POA: Diagnosis not present

## 2016-01-02 DIAGNOSIS — Z872 Personal history of diseases of the skin and subcutaneous tissue: Secondary | ICD-10-CM | POA: Diagnosis not present

## 2016-01-02 DIAGNOSIS — L650 Telogen effluvium: Secondary | ICD-10-CM | POA: Diagnosis not present

## 2016-01-04 DIAGNOSIS — J3081 Allergic rhinitis due to animal (cat) (dog) hair and dander: Secondary | ICD-10-CM | POA: Diagnosis not present

## 2016-01-04 DIAGNOSIS — J3089 Other allergic rhinitis: Secondary | ICD-10-CM | POA: Diagnosis not present

## 2016-01-04 DIAGNOSIS — J301 Allergic rhinitis due to pollen: Secondary | ICD-10-CM | POA: Diagnosis not present

## 2016-01-16 DIAGNOSIS — J3081 Allergic rhinitis due to animal (cat) (dog) hair and dander: Secondary | ICD-10-CM | POA: Diagnosis not present

## 2016-01-16 DIAGNOSIS — J301 Allergic rhinitis due to pollen: Secondary | ICD-10-CM | POA: Diagnosis not present

## 2016-01-16 DIAGNOSIS — J3089 Other allergic rhinitis: Secondary | ICD-10-CM | POA: Diagnosis not present

## 2016-01-30 DIAGNOSIS — J3089 Other allergic rhinitis: Secondary | ICD-10-CM | POA: Diagnosis not present

## 2016-01-30 DIAGNOSIS — J3081 Allergic rhinitis due to animal (cat) (dog) hair and dander: Secondary | ICD-10-CM | POA: Diagnosis not present

## 2016-01-30 DIAGNOSIS — J301 Allergic rhinitis due to pollen: Secondary | ICD-10-CM | POA: Diagnosis not present

## 2016-02-06 DIAGNOSIS — J301 Allergic rhinitis due to pollen: Secondary | ICD-10-CM | POA: Diagnosis not present

## 2016-02-06 DIAGNOSIS — J3081 Allergic rhinitis due to animal (cat) (dog) hair and dander: Secondary | ICD-10-CM | POA: Diagnosis not present

## 2016-02-06 DIAGNOSIS — J3089 Other allergic rhinitis: Secondary | ICD-10-CM | POA: Diagnosis not present

## 2016-02-06 DIAGNOSIS — T783XXD Angioneurotic edema, subsequent encounter: Secondary | ICD-10-CM | POA: Diagnosis not present

## 2016-02-15 ENCOUNTER — Other Ambulatory Visit (INDEPENDENT_AMBULATORY_CARE_PROVIDER_SITE_OTHER): Payer: BLUE CROSS/BLUE SHIELD

## 2016-02-15 DIAGNOSIS — Z0000 Encounter for general adult medical examination without abnormal findings: Secondary | ICD-10-CM | POA: Diagnosis not present

## 2016-02-15 LAB — HEPATIC FUNCTION PANEL
ALT: 31 U/L (ref 0–53)
AST: 25 U/L (ref 0–37)
Albumin: 4.2 g/dL (ref 3.5–5.2)
Alkaline Phosphatase: 45 U/L (ref 39–117)
BILIRUBIN DIRECT: 0.1 mg/dL (ref 0.0–0.3)
BILIRUBIN TOTAL: 0.7 mg/dL (ref 0.2–1.2)
TOTAL PROTEIN: 6.9 g/dL (ref 6.0–8.3)

## 2016-02-15 LAB — BASIC METABOLIC PANEL
BUN: 16 mg/dL (ref 6–23)
CHLORIDE: 106 meq/L (ref 96–112)
CO2: 28 meq/L (ref 19–32)
CREATININE: 1 mg/dL (ref 0.40–1.50)
Calcium: 9.3 mg/dL (ref 8.4–10.5)
GFR: 82.31 mL/min (ref >60.00)
Glucose, Bld: 106 mg/dL — ABNORMAL HIGH (ref 70–99)
Potassium: 3.9 mEq/L (ref 3.5–5.1)
Sodium: 141 mEq/L (ref 135–145)

## 2016-02-15 LAB — POC URINALSYSI DIPSTICK (AUTOMATED)
Bilirubin, UA: NEGATIVE
Glucose, UA: NEGATIVE
KETONES UA: NEGATIVE
Nitrite, UA: NEGATIVE
Spec Grav, UA: 1.02
Urobilinogen, UA: 0.2
pH, UA: 5.5

## 2016-02-15 LAB — CBC WITH DIFFERENTIAL/PLATELET
BASOS PCT: 0.3 % (ref 0.0–3.0)
Basophils Absolute: 0 10*3/uL (ref 0.0–0.1)
EOS ABS: 0.1 10*3/uL (ref 0.0–0.7)
Eosinophils Relative: 1.5 % (ref 0.0–5.0)
HCT: 42.3 % (ref 39.0–52.0)
Hemoglobin: 14.5 g/dL (ref 13.0–17.0)
LYMPHS ABS: 2 10*3/uL (ref 0.7–4.0)
Lymphocytes Relative: 30.7 % (ref 12.0–46.0)
MCHC: 34.2 g/dL (ref 30.0–36.0)
MCV: 89.3 fl (ref 78.0–100.0)
MONO ABS: 0.5 10*3/uL (ref 0.1–1.0)
Monocytes Relative: 7.7 % (ref 3.0–12.0)
NEUTROS PCT: 59.8 % (ref 43.0–77.0)
Neutro Abs: 3.8 10*3/uL (ref 1.4–7.7)
PLATELETS: 309 10*3/uL (ref 150.0–400.0)
RBC: 4.74 Mil/uL (ref 4.22–5.81)
RDW: 12.7 % (ref 11.5–15.5)
WBC: 6.4 10*3/uL (ref 4.0–10.5)

## 2016-02-15 LAB — LIPID PANEL
CHOL/HDL RATIO: 4
Cholesterol: 190 mg/dL (ref 0–200)
HDL: 52.5 mg/dL (ref >39.00)
LDL Cholesterol: 107 mg/dL — ABNORMAL HIGH (ref 0–99)
NonHDL: 137.92
TRIGLYCERIDES: 156 mg/dL — AB (ref 0.0–149.0)
VLDL: 31.2 mg/dL (ref 0.0–40.0)

## 2016-02-15 LAB — TSH: TSH: 1.43 u[IU]/mL (ref 0.35–4.50)

## 2016-02-15 LAB — PSA: PSA: 3.33 ng/mL (ref 0.10–4.00)

## 2016-02-16 DIAGNOSIS — J3089 Other allergic rhinitis: Secondary | ICD-10-CM | POA: Diagnosis not present

## 2016-02-16 DIAGNOSIS — J301 Allergic rhinitis due to pollen: Secondary | ICD-10-CM | POA: Diagnosis not present

## 2016-02-16 DIAGNOSIS — J3081 Allergic rhinitis due to animal (cat) (dog) hair and dander: Secondary | ICD-10-CM | POA: Diagnosis not present

## 2016-02-18 ENCOUNTER — Other Ambulatory Visit: Payer: Self-pay | Admitting: Internal Medicine

## 2016-02-23 DIAGNOSIS — J3081 Allergic rhinitis due to animal (cat) (dog) hair and dander: Secondary | ICD-10-CM | POA: Diagnosis not present

## 2016-02-23 DIAGNOSIS — J301 Allergic rhinitis due to pollen: Secondary | ICD-10-CM | POA: Diagnosis not present

## 2016-02-23 DIAGNOSIS — J3089 Other allergic rhinitis: Secondary | ICD-10-CM | POA: Diagnosis not present

## 2016-02-29 DIAGNOSIS — J3089 Other allergic rhinitis: Secondary | ICD-10-CM | POA: Diagnosis not present

## 2016-02-29 DIAGNOSIS — J301 Allergic rhinitis due to pollen: Secondary | ICD-10-CM | POA: Diagnosis not present

## 2016-02-29 DIAGNOSIS — J3081 Allergic rhinitis due to animal (cat) (dog) hair and dander: Secondary | ICD-10-CM | POA: Diagnosis not present

## 2016-03-04 DIAGNOSIS — J301 Allergic rhinitis due to pollen: Secondary | ICD-10-CM | POA: Diagnosis not present

## 2016-03-04 DIAGNOSIS — J3081 Allergic rhinitis due to animal (cat) (dog) hair and dander: Secondary | ICD-10-CM | POA: Diagnosis not present

## 2016-03-05 ENCOUNTER — Encounter: Payer: BLUE CROSS/BLUE SHIELD | Admitting: Internal Medicine

## 2016-03-05 DIAGNOSIS — J3089 Other allergic rhinitis: Secondary | ICD-10-CM | POA: Diagnosis not present

## 2016-03-05 DIAGNOSIS — J3081 Allergic rhinitis due to animal (cat) (dog) hair and dander: Secondary | ICD-10-CM | POA: Diagnosis not present

## 2016-03-07 DIAGNOSIS — J3089 Other allergic rhinitis: Secondary | ICD-10-CM | POA: Diagnosis not present

## 2016-03-07 DIAGNOSIS — J301 Allergic rhinitis due to pollen: Secondary | ICD-10-CM | POA: Diagnosis not present

## 2016-03-07 DIAGNOSIS — J3081 Allergic rhinitis due to animal (cat) (dog) hair and dander: Secondary | ICD-10-CM | POA: Diagnosis not present

## 2016-03-08 ENCOUNTER — Ambulatory Visit (INDEPENDENT_AMBULATORY_CARE_PROVIDER_SITE_OTHER): Payer: BLUE CROSS/BLUE SHIELD | Admitting: Internal Medicine

## 2016-03-08 ENCOUNTER — Encounter: Payer: Self-pay | Admitting: Internal Medicine

## 2016-03-08 VITALS — BP 168/76 | HR 88 | Temp 98.2°F | Ht 67.0 in | Wt 185.4 lb

## 2016-03-08 DIAGNOSIS — Z0000 Encounter for general adult medical examination without abnormal findings: Secondary | ICD-10-CM

## 2016-03-08 MED ORDER — LISINOPRIL-HYDROCHLOROTHIAZIDE 20-12.5 MG PO TABS: 1.0000 | ORAL_TABLET | Freq: Every day | ORAL | 3 refills | Status: DC

## 2016-03-08 NOTE — Progress Notes (Signed)
Subjective:    Patient ID: Jesse Horne, male    DOB: 23-Mar-1960, 56 y.o.   MRN: [id number]  HPI  56 year old patient who is seen today for a preventive health examination He has a history of borderline blood pressure readings.  He does monitor home blood pressure readings with frequently elevated readings.  Blood pressure today is elevated He generally feels well. He has history of dyslipidemia and remains on modest dose of atorvastatin. No concerns or complaints  Family history noncontributory.  Mother died at 2093.  Father still living at 5597 Social history married 4 children  BP Readings from Last 3 Encounters:  03/08/16 (!) 168/76  10/18/15 (!) 160/90  10/16/15 136/88    Past Medical History:  Diagnosis Date  . At risk for sleep apnea    STOP-BANG= 4    SENT TO PCP 12-24-2013  . Family history of anesthesia complication    SISTER--  PONV  . Hyperlipidemia   . Internal hemorrhoid, bleeding   . Seasonal and perennial allergic rhinitis   . Wears glasses      Social History   Social History  . Marital status: Single    Spouse name: N/A  . Number of children: N/A  . Years of education: N/A   Occupational History  . Not on file.   Social History Main Topics  . Smoking status: Never Smoker  . Smokeless tobacco: Never Used  . Alcohol use 1.2 oz/week    2 Standard drinks or equivalent per week  . Drug use: No  . Sexual activity: Not on file   Other Topics Concern  . Not on file   Social History Narrative  . No narrative on file    Past Surgical History:  Procedure Laterality Date  . COLONOSCOPY N/A 07/29/2012   Procedure: COLONOSCOPY;  Surgeon: Romie LeveeAlicia Thomas, MD;  Location: [facility];  Service: Endoscopy;  Laterality: N/A;   . TRANSANAL HEMORRHOIDAL DEARTERIALIZATION N/A 12/29/2013   Procedure: TRANSANAL HEMORRHOIDAL DEARTERIALIZATION;  Surgeon: Romie LeveeAlicia Thomas, MD;  [facility] [place];  Service: General;  Laterality: N/A;  . TRANSTHORACIC  ECHOCARDIOGRAM  03-17-2008   mild focal septal hypertrophy/  ef 60%/  trivial TR    Family History  Problem Relation Age of Onset  . Cancer Mother     Melanoma, Basal Cell Carcinoma, Breast  . Heart disease Father   . Cancer Sister     Breast  . Heart disease Sister     No Known Allergies  Current Outpatient Prescriptions on File Prior to Visit  Medication Sig Dispense Refill  . atorvastatin (LIPITOR) 20 MG tablet take 1 tablet by mouth once daily 90 tablet 0  . azelastine (ASTELIN) 0.1 % nasal spray Place 1 spray into both nostrils 2 (two) times daily.  3  . EPIPEN 2-PAK 0.3 MG/0.3ML SOAJ as needed.     Marland Kitchen. levocetirizine (XYZAL) 5 MG tablet Take 5 mg by mouth every morning.     . mometasone (NASONEX) 50 MCG/ACT nasal spray Place 2 sprays into the nose as needed.    . Psyllium-Calcium (METAMUCIL PLUS CALCIUM) CAPS Take 8 capsules by mouth daily.    . vitamin E 400 UNIT capsule Take 400 Units by mouth daily.     No current facility-administered medications on file prior to visit.     BP (!) 168/76 (BP Location: Right Arm, Patient Position: Sitting, Cuff Size: Normal)   Pulse 88   Temp 98.2 F (36.8 C) (Oral)   Ht  5\' 7"  (1.702 m)   Wt 185 lb 6.4 oz (84.1 kg)   SpO2 98%   BMI 29.04 kg/m     Review of Systems  Constitutional: Negative for activity change, appetite change, chills, fatigue and fever.  HENT: Negative for congestion, dental problem, ear pain, hearing loss, mouth sores, rhinorrhea, sinus pressure, sneezing, tinnitus, trouble swallowing and voice change.   Eyes: Negative for photophobia, pain, redness and visual disturbance.  Respiratory: Negative for apnea, cough, choking, chest tightness, shortness of breath and wheezing.   Cardiovascular: Negative for chest pain, palpitations and leg swelling.  Gastrointestinal: Negative for abdominal distention, abdominal pain, anal bleeding, blood in stool, constipation, diarrhea, nausea, rectal pain and vomiting.    Genitourinary: Negative for decreased urine volume, difficulty urinating, discharge, dysuria, flank pain, frequency, genital sores, hematuria, penile swelling, scrotal swelling, testicular pain and urgency.  Musculoskeletal: Negative for arthralgias, back pain, gait problem, joint swelling, myalgias, neck pain and neck stiffness.  Skin: Negative for color change, rash and wound.  Neurological: Negative for dizziness, tremors, seizures, syncope, facial asymmetry, speech difficulty, weakness, light-headedness, numbness and headaches.  Hematological: Negative for adenopathy. Does not bruise/bleed easily.  Psychiatric/Behavioral: Negative for agitation, behavioral problems, confusion, decreased concentration, dysphoric mood, hallucinations, self-injury, sleep disturbance and suicidal ideas. The patient is not nervous/anxious.        Objective:   Physical Exam  Constitutional: He appears well-developed and well-nourished.  Blood pressure 160/98  HENT:  Head: Normocephalic and atraumatic.  Right Ear: External ear normal.  Left Ear: External ear normal.  Nose: Nose normal.  Mouth/Throat: Oropharynx is clear and moist.  Eyes: Conjunctivae and EOM are normal. Pupils are equal, round, and reactive to light. No scleral icterus.  Neck: Normal range of motion. Neck supple. No JVD present. No thyromegaly present.  Cardiovascular: Regular rhythm, normal heart sounds and intact distal pulses.  Exam reveals no gallop and no friction rub.   No murmur heard. Pulmonary/Chest: Effort normal and breath sounds normal. He exhibits no tenderness.  Abdominal: Soft. Bowel sounds are normal. He exhibits no distension and no mass. There is no tenderness.  Genitourinary: Prostate normal and penis normal.  Genitourinary Comments: External hemorrhoidal tags  Musculoskeletal: Normal range of motion. He exhibits no edema or tenderness.  Lymphadenopathy:    He has no cervical adenopathy.  Neurological: He is alert. He  has normal reflexes. No cranial nerve deficit. Coordination normal.  Skin: Skin is warm and dry. No rash noted.  Psychiatric: He has a normal mood and affect. His behavior is normal.          Assessment & Plan:   Preventive health examination Dyslipidemia.  Continue atorvastatin Essential hypertension.  DASH Diet reinforced.  Will start combination lisinopril hydrochlorothiazide.  Continue home blood pressure monitoring.  Modest weight loss encouraged exercise regimen encouraged Return in 2 months for follow-up  Rogelia Boga

## 2016-03-08 NOTE — Patient Instructions (Addendum)
Limit your sodium (Salt) intake  Please check your blood pressure on a regular basis.  If it is consistently greater than 150/90, please make an office appointment.    It is important that you exercise regularly, at least 20 minutes 3 to 4 times per week.  If you develop chest pain or shortness of breath seek  medical attention.  You need to lose weight.  Consider a lower calorie diet and regular exercise.  DASH Eating Plan DASH stands for "Dietary Approaches to Stop Hypertension." The DASH eating plan is a healthy eating plan that has been shown to reduce high blood pressure (hypertension). Additional health benefits may include reducing the risk of type 2 diabetes mellitus, heart disease, and stroke. The DASH eating plan may also help with weight loss. What do I need to know about the DASH eating plan? For the DASH eating plan, you will follow these general guidelines:  Choose foods with less than 150 milligrams of sodium per serving (as listed on the food label).  Use salt-free seasonings or herbs instead of table salt or sea salt.  Check with your health care provider or pharmacist before using salt substitutes.  Eat lower-sodium products. These are often labeled as "low-sodium" or "no salt added."  Eat fresh foods. Avoid eating a lot of canned foods.  Eat more vegetables, fruits, and low-fat dairy products.  Choose whole grains. Look for the word "whole" as the first word in the ingredient list.  Choose fish and skinless chicken or turkey more often than red meat. Limit fish, poultry, and meat to 6 oz (170 g) each day.  Limit sweets, desserts, sugars, and sugary drinks.  Choose heart-healthy fats.  Eat more home-cooked food and less restaurant, buffet, and fast food.  Limit fried foods.  Do not fry foods. Cook foods using methods such as baking, boiling, grilling, and broiling instead.  When eating at a restaurant, ask that your food be prepared with less salt, or no salt  if possible. What foods can I eat? Seek help from a dietitian for individual calorie needs. Grains  Whole grain or whole wheat bread. Brown rice. Whole grain or whole wheat pasta. Quinoa, bulgur, and whole grain cereals. Low-sodium cereals. Corn or whole wheat flour tortillas. Whole grain cornbread. Whole grain crackers. Low-sodium crackers. Vegetables  Fresh or frozen vegetables (raw, steamed, roasted, or grilled). Low-sodium or reduced-sodium tomato and vegetable juices. Low-sodium or reduced-sodium tomato sauce and paste. Low-sodium or reduced-sodium canned vegetables. Fruits  All fresh, canned (in natural juice), or frozen fruits. Meat and Other Protein Products  Ground beef (85% or leaner), grass-fed beef, or beef trimmed of fat. Skinless chicken or turkey. Ground chicken or turkey. Pork trimmed of fat. All fish and seafood. Eggs. Dried beans, peas, or lentils. Unsalted nuts and seeds. Unsalted canned beans. Dairy  Low-fat dairy products, such as skim or 1% milk, 2% or reduced-fat cheeses, low-fat ricotta or cottage cheese, or plain low-fat yogurt. Low-sodium or reduced-sodium cheeses. Fats and Oils  Tub margarines without trans fats. Light or reduced-fat mayonnaise and salad dressings (reduced sodium). Avocado. Safflower, olive, or canola oils. Natural peanut or almond butter. Other  Unsalted popcorn and pretzels. The items listed above may not be a complete list of recommended foods or beverages. Contact your dietitian for more options.  What foods are not recommended? Grains  White bread. White pasta. White rice. Refined cornbread. Bagels and croissants. Crackers that contain trans fat. Vegetables  Creamed or fried vegetables. Vegetables in a   cheese sauce. Regular canned vegetables. Regular canned tomato sauce and paste. Regular tomato and vegetable juices. Fruits  Canned fruit in light or heavy syrup. Fruit juice. Meat and Other Protein Products  Fatty cuts of meat. Ribs, chicken  wings, bacon, sausage, bologna, salami, chitterlings, fatback, hot dogs, bratwurst, and packaged luncheon meats. Salted nuts and seeds. Canned beans with salt. Dairy  Whole or 2% milk, cream, half-and-half, and cream cheese. Whole-fat or sweetened yogurt. Full-fat cheeses or blue cheese. Nondairy creamers and whipped toppings. Processed cheese, cheese spreads, or cheese curds. Condiments  Onion and garlic salt, seasoned salt, table salt, and sea salt. Canned and packaged gravies. Worcestershire sauce. Tartar sauce. Barbecue sauce. Teriyaki sauce. Soy sauce, including reduced sodium. Steak sauce. Fish sauce. Oyster sauce. Cocktail sauce. Horseradish. Ketchup and mustard. Meat flavorings and tenderizers. Bouillon cubes. Hot sauce. Tabasco sauce. Marinades. Taco seasonings. Relishes. Fats and Oils  Butter, stick margarine, lard, shortening, ghee, and bacon fat. Coconut, palm kernel, or palm oils. Regular salad dressings. Other  Pickles and olives. Salted popcorn and pretzels. The items listed above may not be a complete list of foods and beverages to avoid. Contact your dietitian for more information.  Where can I find more information? National Heart, Lung, and Blood Institute: www.nhlbi.nih.gov/health/health-topics/topics/dash/ This information is not intended to replace advice given to you by your health care provider. Make sure you discuss any questions you have with your health care provider. Document Released: 01/31/2011 Document Revised: 07/20/2015 Document Reviewed: 12/16/2012 Elsevier Interactive Patient Education  2017 Elsevier Inc.  

## 2016-03-08 NOTE — Progress Notes (Signed)
Pre visit review using our clinic review tool, if applicable. No additional management support is needed unless otherwise documented below in the visit note. 

## 2016-03-15 DIAGNOSIS — J3081 Allergic rhinitis due to animal (cat) (dog) hair and dander: Secondary | ICD-10-CM | POA: Diagnosis not present

## 2016-03-15 DIAGNOSIS — J301 Allergic rhinitis due to pollen: Secondary | ICD-10-CM | POA: Diagnosis not present

## 2016-03-15 DIAGNOSIS — J3089 Other allergic rhinitis: Secondary | ICD-10-CM | POA: Diagnosis not present

## 2016-03-27 ENCOUNTER — Ambulatory Visit (INDEPENDENT_AMBULATORY_CARE_PROVIDER_SITE_OTHER): Payer: BLUE CROSS/BLUE SHIELD | Admitting: Family Medicine

## 2016-03-27 ENCOUNTER — Encounter: Payer: Self-pay | Admitting: Family Medicine

## 2016-03-27 VITALS — BP 148/98 | HR 84 | Temp 98.2°F | Ht 67.0 in | Wt 187.0 lb

## 2016-03-27 DIAGNOSIS — R05 Cough: Secondary | ICD-10-CM

## 2016-03-27 DIAGNOSIS — R059 Cough, unspecified: Secondary | ICD-10-CM

## 2016-03-27 MED ORDER — VALSARTAN-HYDROCHLOROTHIAZIDE 160-12.5 MG PO TABS: 1.0000 | ORAL_TABLET | Freq: Every day | ORAL | 5 refills | Status: DC

## 2016-03-27 NOTE — Progress Notes (Signed)
Subjective:     Patient ID: Jesse Horne, male   DOB: 13-Aug-1960, 56 y.o.   MRN: [id number]  HPI Patient seen with cough for about the past 10 days. Recently started on lisinopril HCTZ. He wonders if this may be ACE inhibitor related.  Cough has been dry. Denies any fevers or chills. No dyspnea. No wheezing. He's had some mild hoarseness. No active GERD symptoms. Cough has been mild to moderate. He held his lisinopril HCTZ earlier today.  Past Medical History:  Diagnosis Date  . At risk for sleep apnea    STOP-BANG= 4    SENT TO PCP 12-24-2013  . Family history of anesthesia complication    SISTER--  PONV  . Hyperlipidemia   . Internal hemorrhoid, bleeding   . Seasonal and perennial allergic rhinitis   . Wears glasses    Past Surgical History:  Procedure Laterality Date  . COLONOSCOPY N/A 07/29/2012   Procedure: COLONOSCOPY;  Surgeon: Romie Levee, MD;  Location: [facility];  Service: Endoscopy;  Laterality: N/A;   . TRANSANAL HEMORRHOIDAL DEARTERIALIZATION N/A 12/29/2013   Procedure: TRANSANAL HEMORRHOIDAL DEARTERIALIZATION;  Surgeon: Romie Levee, MD;  Location: [facility];  Service: General;  Laterality: N/A;  . TRANSTHORACIC ECHOCARDIOGRAM  03-17-2008   mild focal septal hypertrophy/  ef 60%/  trivial TR    reports that he has never smoked. He has never used smokeless tobacco. He reports that he drinks about 1.2 oz of alcohol per week . He reports that he does not use drugs. family history includes Cancer in his mother and sister; Heart disease in his father and sister. No Known Allergies   Review of Systems  Constitutional: Negative for activity change, appetite change, chills, fatigue, fever and unexpected weight change.  HENT: Negative for congestion, ear pain, sore throat and trouble swallowing.   Respiratory: Positive for cough. Negative for shortness of breath, wheezing and stridor.   Cardiovascular: Negative for chest pain and leg swelling.   Gastrointestinal: Negative for abdominal pain.  Musculoskeletal: Negative for arthralgias.  Skin: Negative for rash.  Neurological: Negative for syncope and headaches.  Hematological: Negative for adenopathy.       Objective:   Physical Exam  Constitutional: He is oriented to person, place, and time. He appears well-developed and well-nourished. No distress.  HENT:  Head: Normocephalic and atraumatic.  Eyes: Pupils are equal, round, and reactive to light.  Neck: Normal range of motion. Neck supple. No thyromegaly present.  Cardiovascular: Normal rate and regular rhythm.   No murmur heard. Pulmonary/Chest: Effort normal. No stridor. No respiratory distress. He has no wheezes. He has no rales.  Abdominal: Soft. Bowel sounds are normal. There is no tenderness.  Musculoskeletal: He exhibits no edema.  Lymphadenopathy:    He has no cervical adenopathy.  Neurological: He is alert and oriented to person, place, and time.  Psychiatric: He has a normal mood and affect.       Assessment:     Dry cough. Difficult to sort out whether this may be related to recent lisinopril initiation versus viral process. Nonfocal exam    Plan:     -We gave patient option of switching off lisinopril to angiotensin receptor blocker and he wishes to go ahead and make that change - though explained this could be related to a virus and not ACE inhibitor. -Stop lisinopril HCTZ and start Valsartan- HCTZ 160/12.5 mg once daily -Follow-up with Dr. Kirtland Bouchard as scheduled in March -Follow-up promptly for any fever or increasing  shortness of breath.  Kristian CoveyBruce W Cortana Vanderford MD [facility]

## 2016-03-27 NOTE — Patient Instructions (Signed)
STOP Lisinopril Hctz and start Valsartan HCTZ.

## 2016-03-29 DIAGNOSIS — J301 Allergic rhinitis due to pollen: Secondary | ICD-10-CM | POA: Diagnosis not present

## 2016-03-29 DIAGNOSIS — J3089 Other allergic rhinitis: Secondary | ICD-10-CM | POA: Diagnosis not present

## 2016-03-29 DIAGNOSIS — J3081 Allergic rhinitis due to animal (cat) (dog) hair and dander: Secondary | ICD-10-CM | POA: Diagnosis not present

## 2016-04-09 DIAGNOSIS — J3089 Other allergic rhinitis: Secondary | ICD-10-CM | POA: Diagnosis not present

## 2016-04-09 DIAGNOSIS — J301 Allergic rhinitis due to pollen: Secondary | ICD-10-CM | POA: Diagnosis not present

## 2016-04-09 DIAGNOSIS — J3081 Allergic rhinitis due to animal (cat) (dog) hair and dander: Secondary | ICD-10-CM | POA: Diagnosis not present

## 2016-04-11 DIAGNOSIS — J3081 Allergic rhinitis due to animal (cat) (dog) hair and dander: Secondary | ICD-10-CM | POA: Diagnosis not present

## 2016-04-11 DIAGNOSIS — J3089 Other allergic rhinitis: Secondary | ICD-10-CM | POA: Diagnosis not present

## 2016-04-11 DIAGNOSIS — J301 Allergic rhinitis due to pollen: Secondary | ICD-10-CM | POA: Diagnosis not present

## 2016-04-17 DIAGNOSIS — J3089 Other allergic rhinitis: Secondary | ICD-10-CM | POA: Diagnosis not present

## 2016-04-17 DIAGNOSIS — J301 Allergic rhinitis due to pollen: Secondary | ICD-10-CM | POA: Diagnosis not present

## 2016-04-17 DIAGNOSIS — J3081 Allergic rhinitis due to animal (cat) (dog) hair and dander: Secondary | ICD-10-CM | POA: Diagnosis not present

## 2016-04-19 DIAGNOSIS — J3081 Allergic rhinitis due to animal (cat) (dog) hair and dander: Secondary | ICD-10-CM | POA: Diagnosis not present

## 2016-04-19 DIAGNOSIS — J3089 Other allergic rhinitis: Secondary | ICD-10-CM | POA: Diagnosis not present

## 2016-04-19 DIAGNOSIS — J301 Allergic rhinitis due to pollen: Secondary | ICD-10-CM | POA: Diagnosis not present

## 2016-04-24 DIAGNOSIS — J301 Allergic rhinitis due to pollen: Secondary | ICD-10-CM | POA: Diagnosis not present

## 2016-04-24 DIAGNOSIS — J3081 Allergic rhinitis due to animal (cat) (dog) hair and dander: Secondary | ICD-10-CM | POA: Diagnosis not present

## 2016-04-24 DIAGNOSIS — J3089 Other allergic rhinitis: Secondary | ICD-10-CM | POA: Diagnosis not present

## 2016-05-01 DIAGNOSIS — J3081 Allergic rhinitis due to animal (cat) (dog) hair and dander: Secondary | ICD-10-CM | POA: Diagnosis not present

## 2016-05-01 DIAGNOSIS — J3089 Other allergic rhinitis: Secondary | ICD-10-CM | POA: Diagnosis not present

## 2016-05-01 DIAGNOSIS — J301 Allergic rhinitis due to pollen: Secondary | ICD-10-CM | POA: Diagnosis not present

## 2016-05-03 ENCOUNTER — Ambulatory Visit (INDEPENDENT_AMBULATORY_CARE_PROVIDER_SITE_OTHER): Payer: BLUE CROSS/BLUE SHIELD | Admitting: Internal Medicine

## 2016-05-03 ENCOUNTER — Encounter: Payer: Self-pay | Admitting: Internal Medicine

## 2016-05-03 DIAGNOSIS — I10 Essential (primary) hypertension: Secondary | ICD-10-CM

## 2016-05-03 MED ORDER — VALSARTAN-HYDROCHLOROTHIAZIDE 160-12.5 MG PO TABS: 1.0000 | ORAL_TABLET | Freq: Every day | ORAL | 5 refills | Status: DC

## 2016-05-03 NOTE — Progress Notes (Signed)
Pre visit review using our clinic review tool, if applicable. No additional management support is needed unless otherwise documented below in the visit note. 

## 2016-05-03 NOTE — Progress Notes (Signed)
Subjective:    Patient ID: Jesse Horne, male    DOB: [birth year], 56 y.o.   MRN: [id number]  HPI  56 year old patient with a recent diagnosis of essential hypertension. He was initially placed on lisinopril, hydrochlorothiazide, but quickly developed a cough Presently he is on valsartan hydrochlorothiazide and feels quite well. There is been some modest weight loss Generally tolerated the medication well  Past Medical History:  Diagnosis Date  . At risk for sleep apnea    STOP-BANG= 4    SENT TO PCP 12-24-2013  . Family history of anesthesia complication    SISTER--  PONV  . Hyperlipidemia   . Internal hemorrhoid, bleeding   . Seasonal and perennial allergic rhinitis   . Wears glasses      Social History   Social History  . Marital status: Single    Spouse name: N/A  . Number of children: N/A  . Years of education: N/A   Occupational History  . Not on file.   Social History Main Topics  . Smoking status: Never Smoker  . Smokeless tobacco: Never Used  . Alcohol use 1.2 oz/week    2 Standard drinks or equivalent per week  . Drug use: No  . Sexual activity: Not on file   Other Topics Concern  . Not on file   Social History Narrative  . No narrative on file    Past Surgical History:  Procedure Laterality Date  . COLONOSCOPY N/A 07/29/2012   Procedure: COLONOSCOPY;  Surgeon: Romie Levee, MD;  Location: [facility];  Service: Endoscopy;  Laterality: N/A;   . TRANSANAL HEMORRHOIDAL DEARTERIALIZATION N/A 12/29/2013   Procedure: TRANSANAL HEMORRHOIDAL DEARTERIALIZATION;  Surgeon: Romie Levee, MD;  Location: [facility];  Service: General;  Laterality: N/A;  . TRANSTHORACIC ECHOCARDIOGRAM  03-17-2008   mild focal septal hypertrophy/  ef 60%/  trivial TR    Family History  Problem Relation Age of Onset  . Cancer Mother     Melanoma, Basal Cell Carcinoma, Breast  . Heart disease Father   . Cancer Sister     Breast  . Heart disease Sister      No Known Allergies  Current Outpatient Prescriptions on File Prior to Visit  Medication Sig Dispense Refill  . atorvastatin (LIPITOR) 20 MG tablet take 1 tablet by mouth once daily 90 tablet 0  . azelastine (ASTELIN) 0.1 % nasal spray Place 1 spray into both nostrils 2 (two) times daily.  3  . EPIPEN 2-PAK 0.3 MG/0.3ML SOAJ as needed.     Marland Kitchen levocetirizine (XYZAL) 5 MG tablet Take 5 mg by mouth every morning.     . mometasone (NASONEX) 50 MCG/ACT nasal spray Place 2 sprays into the nose as needed.    . Psyllium-Calcium (METAMUCIL PLUS CALCIUM) CAPS Take 8 capsules by mouth daily.    . vitamin C (ASCORBIC ACID) 500 MG tablet Take 500 mg by mouth daily.    . vitamin E 400 UNIT capsule Take 400 Units by mouth daily.     No current facility-administered medications on file prior to visit.     BP 118/68 (BP Location: Left Arm, Patient Position: Sitting, Cuff Size: Normal)   Pulse 95   Temp 97.9 F (36.6 C) (Oral)   Ht 5\' 7"  (1.702 m)   Wt 182 lb (82.6 kg)   SpO2 98%   BMI 28.51 kg/m     Review of Systems  Constitutional: Negative.        Objective:  Physical Exam  Constitutional: He appears well-developed and well-nourished. No distress.  Weight 182 Initial blood pressure 118/68 Repeat blood pressure 120/78          Assessment & Plan:   Essential hypertension, stable  No change in medical regimen Nonpharmacologic measures discussed Follow-up 6 months or when necessary  Rogelia BogaKWIATKOWSKI,PETER FRANK

## 2016-05-03 NOTE — Patient Instructions (Signed)

## 2016-05-08 DIAGNOSIS — J3089 Other allergic rhinitis: Secondary | ICD-10-CM | POA: Diagnosis not present

## 2016-05-08 DIAGNOSIS — J301 Allergic rhinitis due to pollen: Secondary | ICD-10-CM | POA: Diagnosis not present

## 2016-05-08 DIAGNOSIS — J3081 Allergic rhinitis due to animal (cat) (dog) hair and dander: Secondary | ICD-10-CM | POA: Diagnosis not present

## 2016-05-09 DIAGNOSIS — M542 Cervicalgia: Secondary | ICD-10-CM | POA: Diagnosis not present

## 2016-05-09 DIAGNOSIS — M9901 Segmental and somatic dysfunction of cervical region: Secondary | ICD-10-CM | POA: Diagnosis not present

## 2016-05-09 DIAGNOSIS — M9902 Segmental and somatic dysfunction of thoracic region: Secondary | ICD-10-CM | POA: Diagnosis not present

## 2016-05-09 DIAGNOSIS — M4712 Other spondylosis with myelopathy, cervical region: Secondary | ICD-10-CM | POA: Diagnosis not present

## 2016-05-13 DIAGNOSIS — M4712 Other spondylosis with myelopathy, cervical region: Secondary | ICD-10-CM | POA: Diagnosis not present

## 2016-05-13 DIAGNOSIS — M9902 Segmental and somatic dysfunction of thoracic region: Secondary | ICD-10-CM | POA: Diagnosis not present

## 2016-05-13 DIAGNOSIS — M9901 Segmental and somatic dysfunction of cervical region: Secondary | ICD-10-CM | POA: Diagnosis not present

## 2016-05-13 DIAGNOSIS — M542 Cervicalgia: Secondary | ICD-10-CM | POA: Diagnosis not present

## 2016-05-14 DIAGNOSIS — M9902 Segmental and somatic dysfunction of thoracic region: Secondary | ICD-10-CM | POA: Diagnosis not present

## 2016-05-14 DIAGNOSIS — M9901 Segmental and somatic dysfunction of cervical region: Secondary | ICD-10-CM | POA: Diagnosis not present

## 2016-05-14 DIAGNOSIS — M4712 Other spondylosis with myelopathy, cervical region: Secondary | ICD-10-CM | POA: Diagnosis not present

## 2016-05-14 DIAGNOSIS — M542 Cervicalgia: Secondary | ICD-10-CM | POA: Diagnosis not present

## 2016-05-16 DIAGNOSIS — J3081 Allergic rhinitis due to animal (cat) (dog) hair and dander: Secondary | ICD-10-CM | POA: Diagnosis not present

## 2016-05-16 DIAGNOSIS — J3089 Other allergic rhinitis: Secondary | ICD-10-CM | POA: Diagnosis not present

## 2016-05-16 DIAGNOSIS — J301 Allergic rhinitis due to pollen: Secondary | ICD-10-CM | POA: Diagnosis not present

## 2016-05-22 DIAGNOSIS — M4712 Other spondylosis with myelopathy, cervical region: Secondary | ICD-10-CM | POA: Diagnosis not present

## 2016-05-22 DIAGNOSIS — M9902 Segmental and somatic dysfunction of thoracic region: Secondary | ICD-10-CM | POA: Diagnosis not present

## 2016-05-22 DIAGNOSIS — M542 Cervicalgia: Secondary | ICD-10-CM | POA: Diagnosis not present

## 2016-05-22 DIAGNOSIS — M9901 Segmental and somatic dysfunction of cervical region: Secondary | ICD-10-CM | POA: Diagnosis not present

## 2016-05-23 DIAGNOSIS — M9902 Segmental and somatic dysfunction of thoracic region: Secondary | ICD-10-CM | POA: Diagnosis not present

## 2016-05-23 DIAGNOSIS — M4712 Other spondylosis with myelopathy, cervical region: Secondary | ICD-10-CM | POA: Diagnosis not present

## 2016-05-23 DIAGNOSIS — M542 Cervicalgia: Secondary | ICD-10-CM | POA: Diagnosis not present

## 2016-05-23 DIAGNOSIS — M9901 Segmental and somatic dysfunction of cervical region: Secondary | ICD-10-CM | POA: Diagnosis not present

## 2016-05-27 DIAGNOSIS — M9902 Segmental and somatic dysfunction of thoracic region: Secondary | ICD-10-CM | POA: Diagnosis not present

## 2016-05-27 DIAGNOSIS — M9901 Segmental and somatic dysfunction of cervical region: Secondary | ICD-10-CM | POA: Diagnosis not present

## 2016-05-27 DIAGNOSIS — M4712 Other spondylosis with myelopathy, cervical region: Secondary | ICD-10-CM | POA: Diagnosis not present

## 2016-05-27 DIAGNOSIS — M542 Cervicalgia: Secondary | ICD-10-CM | POA: Diagnosis not present

## 2016-05-28 DIAGNOSIS — M542 Cervicalgia: Secondary | ICD-10-CM | POA: Diagnosis not present

## 2016-05-28 DIAGNOSIS — M9901 Segmental and somatic dysfunction of cervical region: Secondary | ICD-10-CM | POA: Diagnosis not present

## 2016-05-28 DIAGNOSIS — M9902 Segmental and somatic dysfunction of thoracic region: Secondary | ICD-10-CM | POA: Diagnosis not present

## 2016-05-28 DIAGNOSIS — M4712 Other spondylosis with myelopathy, cervical region: Secondary | ICD-10-CM | POA: Diagnosis not present

## 2016-06-01 DIAGNOSIS — M4712 Other spondylosis with myelopathy, cervical region: Secondary | ICD-10-CM | POA: Diagnosis not present

## 2016-06-01 DIAGNOSIS — M542 Cervicalgia: Secondary | ICD-10-CM | POA: Diagnosis not present

## 2016-06-01 DIAGNOSIS — M9902 Segmental and somatic dysfunction of thoracic region: Secondary | ICD-10-CM | POA: Diagnosis not present

## 2016-06-01 DIAGNOSIS — M9901 Segmental and somatic dysfunction of cervical region: Secondary | ICD-10-CM | POA: Diagnosis not present

## 2016-06-03 DIAGNOSIS — M9902 Segmental and somatic dysfunction of thoracic region: Secondary | ICD-10-CM | POA: Diagnosis not present

## 2016-06-03 DIAGNOSIS — M542 Cervicalgia: Secondary | ICD-10-CM | POA: Diagnosis not present

## 2016-06-03 DIAGNOSIS — M4712 Other spondylosis with myelopathy, cervical region: Secondary | ICD-10-CM | POA: Diagnosis not present

## 2016-06-03 DIAGNOSIS — M9901 Segmental and somatic dysfunction of cervical region: Secondary | ICD-10-CM | POA: Diagnosis not present

## 2016-06-04 DIAGNOSIS — M9901 Segmental and somatic dysfunction of cervical region: Secondary | ICD-10-CM | POA: Diagnosis not present

## 2016-06-04 DIAGNOSIS — J301 Allergic rhinitis due to pollen: Secondary | ICD-10-CM | POA: Diagnosis not present

## 2016-06-04 DIAGNOSIS — J3089 Other allergic rhinitis: Secondary | ICD-10-CM | POA: Diagnosis not present

## 2016-06-04 DIAGNOSIS — M9902 Segmental and somatic dysfunction of thoracic region: Secondary | ICD-10-CM | POA: Diagnosis not present

## 2016-06-04 DIAGNOSIS — M4712 Other spondylosis with myelopathy, cervical region: Secondary | ICD-10-CM | POA: Diagnosis not present

## 2016-06-04 DIAGNOSIS — J3081 Allergic rhinitis due to animal (cat) (dog) hair and dander: Secondary | ICD-10-CM | POA: Diagnosis not present

## 2016-06-04 DIAGNOSIS — M542 Cervicalgia: Secondary | ICD-10-CM | POA: Diagnosis not present

## 2016-06-05 ENCOUNTER — Telehealth: Payer: Self-pay | Admitting: Internal Medicine

## 2016-06-05 DIAGNOSIS — M9901 Segmental and somatic dysfunction of cervical region: Secondary | ICD-10-CM | POA: Diagnosis not present

## 2016-06-05 DIAGNOSIS — M542 Cervicalgia: Secondary | ICD-10-CM | POA: Diagnosis not present

## 2016-06-05 DIAGNOSIS — M4712 Other spondylosis with myelopathy, cervical region: Secondary | ICD-10-CM | POA: Diagnosis not present

## 2016-06-05 DIAGNOSIS — M9902 Segmental and somatic dysfunction of thoracic region: Secondary | ICD-10-CM | POA: Diagnosis not present

## 2016-06-05 MED ORDER — ATORVASTATIN CALCIUM 20 MG PO TABS: 20.0000 mg | ORAL_TABLET | Freq: Every day | ORAL | 0 refills | Status: DC

## 2016-06-05 NOTE — Telephone Encounter (Signed)
Mediation was refilled.

## 2016-06-05 NOTE — Telephone Encounter (Signed)
Pt request refill  atorvastatin (LIPITOR) 20 MG tablet  Pt saw Dr Kirtland Bouchard 3/09 and thought his rx was sent to the pharmacy.  Pt states he called them Sunday and this has not been done.  Pt is going out of town today at 1 pm and hopes to get before he leaves.   [id number] - [place], [place] - [address] AT [address] OF [facility] [address]

## 2016-06-07 ENCOUNTER — Other Ambulatory Visit: Payer: Self-pay | Admitting: Internal Medicine

## 2016-06-07 MED ORDER — ATORVASTATIN CALCIUM 20 MG PO TABS: 20.0000 mg | ORAL_TABLET | Freq: Every day | ORAL | 2 refills | Status: DC

## 2016-06-12 DIAGNOSIS — M542 Cervicalgia: Secondary | ICD-10-CM | POA: Diagnosis not present

## 2016-06-12 DIAGNOSIS — M9902 Segmental and somatic dysfunction of thoracic region: Secondary | ICD-10-CM | POA: Diagnosis not present

## 2016-06-12 DIAGNOSIS — J3089 Other allergic rhinitis: Secondary | ICD-10-CM | POA: Diagnosis not present

## 2016-06-12 DIAGNOSIS — M9901 Segmental and somatic dysfunction of cervical region: Secondary | ICD-10-CM | POA: Diagnosis not present

## 2016-06-12 DIAGNOSIS — J3081 Allergic rhinitis due to animal (cat) (dog) hair and dander: Secondary | ICD-10-CM | POA: Diagnosis not present

## 2016-06-12 DIAGNOSIS — J301 Allergic rhinitis due to pollen: Secondary | ICD-10-CM | POA: Diagnosis not present

## 2016-06-12 DIAGNOSIS — M4712 Other spondylosis with myelopathy, cervical region: Secondary | ICD-10-CM | POA: Diagnosis not present

## 2016-06-19 DIAGNOSIS — M542 Cervicalgia: Secondary | ICD-10-CM | POA: Diagnosis not present

## 2016-06-19 DIAGNOSIS — M9901 Segmental and somatic dysfunction of cervical region: Secondary | ICD-10-CM | POA: Diagnosis not present

## 2016-06-19 DIAGNOSIS — M9902 Segmental and somatic dysfunction of thoracic region: Secondary | ICD-10-CM | POA: Diagnosis not present

## 2016-06-19 DIAGNOSIS — M4712 Other spondylosis with myelopathy, cervical region: Secondary | ICD-10-CM | POA: Diagnosis not present

## 2016-06-20 DIAGNOSIS — J3081 Allergic rhinitis due to animal (cat) (dog) hair and dander: Secondary | ICD-10-CM | POA: Diagnosis not present

## 2016-06-20 DIAGNOSIS — M542 Cervicalgia: Secondary | ICD-10-CM | POA: Diagnosis not present

## 2016-06-20 DIAGNOSIS — M9901 Segmental and somatic dysfunction of cervical region: Secondary | ICD-10-CM | POA: Diagnosis not present

## 2016-06-20 DIAGNOSIS — M9902 Segmental and somatic dysfunction of thoracic region: Secondary | ICD-10-CM | POA: Diagnosis not present

## 2016-06-20 DIAGNOSIS — J301 Allergic rhinitis due to pollen: Secondary | ICD-10-CM | POA: Diagnosis not present

## 2016-06-20 DIAGNOSIS — M4712 Other spondylosis with myelopathy, cervical region: Secondary | ICD-10-CM | POA: Diagnosis not present

## 2016-06-20 DIAGNOSIS — J3089 Other allergic rhinitis: Secondary | ICD-10-CM | POA: Diagnosis not present

## 2016-06-27 DIAGNOSIS — J3089 Other allergic rhinitis: Secondary | ICD-10-CM | POA: Diagnosis not present

## 2016-06-27 DIAGNOSIS — J3081 Allergic rhinitis due to animal (cat) (dog) hair and dander: Secondary | ICD-10-CM | POA: Diagnosis not present

## 2016-06-27 DIAGNOSIS — J301 Allergic rhinitis due to pollen: Secondary | ICD-10-CM | POA: Diagnosis not present

## 2016-07-03 DIAGNOSIS — J301 Allergic rhinitis due to pollen: Secondary | ICD-10-CM | POA: Diagnosis not present

## 2016-07-03 DIAGNOSIS — J3089 Other allergic rhinitis: Secondary | ICD-10-CM | POA: Diagnosis not present

## 2016-07-03 DIAGNOSIS — J3081 Allergic rhinitis due to animal (cat) (dog) hair and dander: Secondary | ICD-10-CM | POA: Diagnosis not present

## 2016-07-09 DIAGNOSIS — L648 Other androgenic alopecia: Secondary | ICD-10-CM | POA: Diagnosis not present

## 2016-07-09 DIAGNOSIS — D1801 Hemangioma of skin and subcutaneous tissue: Secondary | ICD-10-CM | POA: Diagnosis not present

## 2016-07-09 DIAGNOSIS — L821 Other seborrheic keratosis: Secondary | ICD-10-CM | POA: Diagnosis not present

## 2016-07-09 DIAGNOSIS — L814 Other melanin hyperpigmentation: Secondary | ICD-10-CM | POA: Diagnosis not present

## 2016-07-15 DIAGNOSIS — J301 Allergic rhinitis due to pollen: Secondary | ICD-10-CM | POA: Diagnosis not present

## 2016-07-15 DIAGNOSIS — J3081 Allergic rhinitis due to animal (cat) (dog) hair and dander: Secondary | ICD-10-CM | POA: Diagnosis not present

## 2016-07-15 DIAGNOSIS — J3089 Other allergic rhinitis: Secondary | ICD-10-CM | POA: Diagnosis not present

## 2016-07-26 DIAGNOSIS — J3081 Allergic rhinitis due to animal (cat) (dog) hair and dander: Secondary | ICD-10-CM | POA: Diagnosis not present

## 2016-07-26 DIAGNOSIS — J301 Allergic rhinitis due to pollen: Secondary | ICD-10-CM | POA: Diagnosis not present

## 2016-07-26 DIAGNOSIS — J3089 Other allergic rhinitis: Secondary | ICD-10-CM | POA: Diagnosis not present

## 2016-08-01 DIAGNOSIS — J3081 Allergic rhinitis due to animal (cat) (dog) hair and dander: Secondary | ICD-10-CM | POA: Diagnosis not present

## 2016-08-01 DIAGNOSIS — J301 Allergic rhinitis due to pollen: Secondary | ICD-10-CM | POA: Diagnosis not present

## 2016-08-01 DIAGNOSIS — J3089 Other allergic rhinitis: Secondary | ICD-10-CM | POA: Diagnosis not present

## 2016-08-14 DIAGNOSIS — J3089 Other allergic rhinitis: Secondary | ICD-10-CM | POA: Diagnosis not present

## 2016-08-14 DIAGNOSIS — J301 Allergic rhinitis due to pollen: Secondary | ICD-10-CM | POA: Diagnosis not present

## 2016-08-14 DIAGNOSIS — J3081 Allergic rhinitis due to animal (cat) (dog) hair and dander: Secondary | ICD-10-CM | POA: Diagnosis not present

## 2016-08-23 DIAGNOSIS — J301 Allergic rhinitis due to pollen: Secondary | ICD-10-CM | POA: Diagnosis not present

## 2016-08-23 DIAGNOSIS — J3089 Other allergic rhinitis: Secondary | ICD-10-CM | POA: Diagnosis not present

## 2016-08-23 DIAGNOSIS — J3081 Allergic rhinitis due to animal (cat) (dog) hair and dander: Secondary | ICD-10-CM | POA: Diagnosis not present

## 2016-09-04 DIAGNOSIS — J301 Allergic rhinitis due to pollen: Secondary | ICD-10-CM | POA: Diagnosis not present

## 2016-09-04 DIAGNOSIS — J3081 Allergic rhinitis due to animal (cat) (dog) hair and dander: Secondary | ICD-10-CM | POA: Diagnosis not present

## 2016-09-04 DIAGNOSIS — J3089 Other allergic rhinitis: Secondary | ICD-10-CM | POA: Diagnosis not present

## 2016-09-18 DIAGNOSIS — J301 Allergic rhinitis due to pollen: Secondary | ICD-10-CM | POA: Diagnosis not present

## 2016-09-18 DIAGNOSIS — J3081 Allergic rhinitis due to animal (cat) (dog) hair and dander: Secondary | ICD-10-CM | POA: Diagnosis not present

## 2016-09-18 DIAGNOSIS — J3089 Other allergic rhinitis: Secondary | ICD-10-CM | POA: Diagnosis not present

## 2016-09-20 DIAGNOSIS — J301 Allergic rhinitis due to pollen: Secondary | ICD-10-CM | POA: Diagnosis not present

## 2016-09-20 DIAGNOSIS — J3081 Allergic rhinitis due to animal (cat) (dog) hair and dander: Secondary | ICD-10-CM | POA: Diagnosis not present

## 2016-09-23 DIAGNOSIS — J3089 Other allergic rhinitis: Secondary | ICD-10-CM | POA: Diagnosis not present

## 2016-09-25 DIAGNOSIS — J3089 Other allergic rhinitis: Secondary | ICD-10-CM | POA: Diagnosis not present

## 2016-09-25 DIAGNOSIS — J301 Allergic rhinitis due to pollen: Secondary | ICD-10-CM | POA: Diagnosis not present

## 2016-09-25 DIAGNOSIS — J3081 Allergic rhinitis due to animal (cat) (dog) hair and dander: Secondary | ICD-10-CM | POA: Diagnosis not present

## 2016-10-01 DIAGNOSIS — J3081 Allergic rhinitis due to animal (cat) (dog) hair and dander: Secondary | ICD-10-CM | POA: Diagnosis not present

## 2016-10-01 DIAGNOSIS — J301 Allergic rhinitis due to pollen: Secondary | ICD-10-CM | POA: Diagnosis not present

## 2016-10-01 DIAGNOSIS — J3089 Other allergic rhinitis: Secondary | ICD-10-CM | POA: Diagnosis not present

## 2016-10-08 DIAGNOSIS — J3081 Allergic rhinitis due to animal (cat) (dog) hair and dander: Secondary | ICD-10-CM | POA: Diagnosis not present

## 2016-10-08 DIAGNOSIS — J301 Allergic rhinitis due to pollen: Secondary | ICD-10-CM | POA: Diagnosis not present

## 2016-10-08 DIAGNOSIS — J3089 Other allergic rhinitis: Secondary | ICD-10-CM | POA: Diagnosis not present

## 2016-10-10 DIAGNOSIS — J301 Allergic rhinitis due to pollen: Secondary | ICD-10-CM | POA: Diagnosis not present

## 2016-10-10 DIAGNOSIS — J3081 Allergic rhinitis due to animal (cat) (dog) hair and dander: Secondary | ICD-10-CM | POA: Diagnosis not present

## 2016-10-10 DIAGNOSIS — J3089 Other allergic rhinitis: Secondary | ICD-10-CM | POA: Diagnosis not present

## 2016-10-15 DIAGNOSIS — J3089 Other allergic rhinitis: Secondary | ICD-10-CM | POA: Diagnosis not present

## 2016-10-15 DIAGNOSIS — J3081 Allergic rhinitis due to animal (cat) (dog) hair and dander: Secondary | ICD-10-CM | POA: Diagnosis not present

## 2016-10-15 DIAGNOSIS — J301 Allergic rhinitis due to pollen: Secondary | ICD-10-CM | POA: Diagnosis not present

## 2016-10-17 DIAGNOSIS — J3089 Other allergic rhinitis: Secondary | ICD-10-CM | POA: Diagnosis not present

## 2016-10-17 DIAGNOSIS — J3081 Allergic rhinitis due to animal (cat) (dog) hair and dander: Secondary | ICD-10-CM | POA: Diagnosis not present

## 2016-10-17 DIAGNOSIS — J301 Allergic rhinitis due to pollen: Secondary | ICD-10-CM | POA: Diagnosis not present

## 2016-10-23 DIAGNOSIS — J3081 Allergic rhinitis due to animal (cat) (dog) hair and dander: Secondary | ICD-10-CM | POA: Diagnosis not present

## 2016-10-23 DIAGNOSIS — J301 Allergic rhinitis due to pollen: Secondary | ICD-10-CM | POA: Diagnosis not present

## 2016-10-23 DIAGNOSIS — J3089 Other allergic rhinitis: Secondary | ICD-10-CM | POA: Diagnosis not present

## 2016-11-05 DIAGNOSIS — J3081 Allergic rhinitis due to animal (cat) (dog) hair and dander: Secondary | ICD-10-CM | POA: Diagnosis not present

## 2016-11-05 DIAGNOSIS — J301 Allergic rhinitis due to pollen: Secondary | ICD-10-CM | POA: Diagnosis not present

## 2016-11-05 DIAGNOSIS — J3089 Other allergic rhinitis: Secondary | ICD-10-CM | POA: Diagnosis not present

## 2016-11-14 ENCOUNTER — Encounter: Payer: Self-pay | Admitting: Internal Medicine

## 2016-11-21 DIAGNOSIS — J3081 Allergic rhinitis due to animal (cat) (dog) hair and dander: Secondary | ICD-10-CM | POA: Diagnosis not present

## 2016-11-21 DIAGNOSIS — J301 Allergic rhinitis due to pollen: Secondary | ICD-10-CM | POA: Diagnosis not present

## 2016-11-21 DIAGNOSIS — J3089 Other allergic rhinitis: Secondary | ICD-10-CM | POA: Diagnosis not present

## 2016-12-13 DIAGNOSIS — J301 Allergic rhinitis due to pollen: Secondary | ICD-10-CM | POA: Diagnosis not present

## 2016-12-13 DIAGNOSIS — J3089 Other allergic rhinitis: Secondary | ICD-10-CM | POA: Diagnosis not present

## 2016-12-13 DIAGNOSIS — J3081 Allergic rhinitis due to animal (cat) (dog) hair and dander: Secondary | ICD-10-CM | POA: Diagnosis not present

## 2016-12-27 DIAGNOSIS — J301 Allergic rhinitis due to pollen: Secondary | ICD-10-CM | POA: Diagnosis not present

## 2016-12-27 DIAGNOSIS — J3081 Allergic rhinitis due to animal (cat) (dog) hair and dander: Secondary | ICD-10-CM | POA: Diagnosis not present

## 2016-12-27 DIAGNOSIS — J3089 Other allergic rhinitis: Secondary | ICD-10-CM | POA: Diagnosis not present

## 2017-01-29 DIAGNOSIS — J3089 Other allergic rhinitis: Secondary | ICD-10-CM | POA: Diagnosis not present

## 2017-01-29 DIAGNOSIS — J301 Allergic rhinitis due to pollen: Secondary | ICD-10-CM | POA: Diagnosis not present

## 2017-01-29 DIAGNOSIS — J3081 Allergic rhinitis due to animal (cat) (dog) hair and dander: Secondary | ICD-10-CM | POA: Diagnosis not present

## 2017-02-05 DIAGNOSIS — T783XXD Angioneurotic edema, subsequent encounter: Secondary | ICD-10-CM | POA: Diagnosis not present

## 2017-02-05 DIAGNOSIS — J3081 Allergic rhinitis due to animal (cat) (dog) hair and dander: Secondary | ICD-10-CM | POA: Diagnosis not present

## 2017-02-05 DIAGNOSIS — J301 Allergic rhinitis due to pollen: Secondary | ICD-10-CM | POA: Diagnosis not present

## 2017-02-05 DIAGNOSIS — J019 Acute sinusitis, unspecified: Secondary | ICD-10-CM | POA: Diagnosis not present

## 2017-02-05 DIAGNOSIS — J3089 Other allergic rhinitis: Secondary | ICD-10-CM | POA: Diagnosis not present

## 2017-02-19 DIAGNOSIS — J310 Chronic rhinitis: Secondary | ICD-10-CM | POA: Diagnosis not present

## 2017-02-19 DIAGNOSIS — J343 Hypertrophy of nasal turbinates: Secondary | ICD-10-CM | POA: Diagnosis not present

## 2017-02-19 DIAGNOSIS — J342 Deviated nasal septum: Secondary | ICD-10-CM | POA: Diagnosis not present

## 2017-02-24 ENCOUNTER — Other Ambulatory Visit (INDEPENDENT_AMBULATORY_CARE_PROVIDER_SITE_OTHER): Payer: Self-pay | Admitting: Otolaryngology

## 2017-02-24 DIAGNOSIS — J329 Chronic sinusitis, unspecified: Secondary | ICD-10-CM

## 2017-03-04 ENCOUNTER — Ambulatory Visit
Admission: RE | Admit: 2017-03-04 | Discharge: 2017-03-04 | Disposition: A | Discharge status: Home / Self Care | Payer: BLUE CROSS/BLUE SHIELD | Source: Ambulatory Visit | Attending: Otolaryngology | Admitting: Otolaryngology

## 2017-03-04 DIAGNOSIS — J329 Chronic sinusitis, unspecified: Secondary | ICD-10-CM | POA: Diagnosis not present

## 2017-03-05 DIAGNOSIS — J301 Allergic rhinitis due to pollen: Secondary | ICD-10-CM | POA: Diagnosis not present

## 2017-03-05 DIAGNOSIS — J3081 Allergic rhinitis due to animal (cat) (dog) hair and dander: Secondary | ICD-10-CM | POA: Diagnosis not present

## 2017-03-05 DIAGNOSIS — J3089 Other allergic rhinitis: Secondary | ICD-10-CM | POA: Diagnosis not present

## 2017-03-11 DIAGNOSIS — J342 Deviated nasal septum: Secondary | ICD-10-CM | POA: Diagnosis not present

## 2017-03-11 DIAGNOSIS — J343 Hypertrophy of nasal turbinates: Secondary | ICD-10-CM | POA: Diagnosis not present

## 2017-03-11 DIAGNOSIS — J320 Chronic maxillary sinusitis: Secondary | ICD-10-CM | POA: Diagnosis not present

## 2017-03-26 DIAGNOSIS — J3089 Other allergic rhinitis: Secondary | ICD-10-CM | POA: Diagnosis not present

## 2017-03-26 DIAGNOSIS — J3081 Allergic rhinitis due to animal (cat) (dog) hair and dander: Secondary | ICD-10-CM | POA: Diagnosis not present

## 2017-03-26 DIAGNOSIS — J301 Allergic rhinitis due to pollen: Secondary | ICD-10-CM | POA: Diagnosis not present

## 2017-04-11 DIAGNOSIS — J3081 Allergic rhinitis due to animal (cat) (dog) hair and dander: Secondary | ICD-10-CM | POA: Diagnosis not present

## 2017-04-11 DIAGNOSIS — J3089 Other allergic rhinitis: Secondary | ICD-10-CM | POA: Diagnosis not present

## 2017-04-11 DIAGNOSIS — J301 Allergic rhinitis due to pollen: Secondary | ICD-10-CM | POA: Diagnosis not present

## 2017-04-22 ENCOUNTER — Encounter: Payer: Self-pay | Admitting: Internal Medicine

## 2017-04-22 ENCOUNTER — Ambulatory Visit (INDEPENDENT_AMBULATORY_CARE_PROVIDER_SITE_OTHER): Payer: BLUE CROSS/BLUE SHIELD | Admitting: Internal Medicine

## 2017-04-22 VITALS — BP 120/82 | HR 81 | Temp 99.0°F | Ht 67.0 in | Wt 173.0 lb

## 2017-04-22 DIAGNOSIS — Z0000 Encounter for general adult medical examination without abnormal findings: Secondary | ICD-10-CM | POA: Diagnosis not present

## 2017-04-22 DIAGNOSIS — Z125 Encounter for screening for malignant neoplasm of prostate: Secondary | ICD-10-CM

## 2017-04-22 DIAGNOSIS — R972 Elevated prostate specific antigen [PSA]: Secondary | ICD-10-CM | POA: Diagnosis not present

## 2017-04-22 LAB — COMPREHENSIVE METABOLIC PANEL
ALT: 36 U/L (ref 0–53)
AST: 41 U/L — ABNORMAL HIGH (ref 0–37)
Albumin: 4.1 g/dL (ref 3.5–5.2)
Alkaline Phosphatase: 33 U/L — ABNORMAL LOW (ref 39–117)
BUN: 17 mg/dL (ref 6–23)
CHLORIDE: 101 meq/L (ref 96–112)
CO2: 33 mEq/L — ABNORMAL HIGH (ref 19–32)
Calcium: 9.6 mg/dL (ref 8.4–10.5)
Creatinine, Ser: 0.96 mg/dL (ref 0.40–1.50)
GFR: 85.91 mL/min (ref >60.00)
GLUCOSE: 97 mg/dL (ref 70–99)
POTASSIUM: 3.9 meq/L (ref 3.5–5.1)
SODIUM: 141 meq/L (ref 135–145)
Total Bilirubin: 1.2 mg/dL (ref 0.2–1.2)
Total Protein: 6.8 g/dL (ref 6.0–8.3)

## 2017-04-22 LAB — LIPID PANEL
CHOL/HDL RATIO: 3
Cholesterol: 172 mg/dL (ref 0–200)
HDL: 58.1 mg/dL (ref >39.00)
LDL CALC: 84 mg/dL (ref 0–99)
NONHDL: 113.58
Triglycerides: 150 mg/dL — ABNORMAL HIGH (ref 0.0–149.0)
VLDL: 30 mg/dL (ref 0.0–40.0)

## 2017-04-22 LAB — CBC WITH DIFFERENTIAL/PLATELET
BASOS PCT: 0.3 % (ref 0.0–3.0)
Basophils Absolute: 0 10*3/uL (ref 0.0–0.1)
EOS PCT: 1.2 % (ref 0.0–5.0)
Eosinophils Absolute: 0.1 10*3/uL (ref 0.0–0.7)
HCT: 41 % (ref 39.0–52.0)
HEMOGLOBIN: 13.6 g/dL (ref 13.0–17.0)
LYMPHS ABS: 1.9 10*3/uL (ref 0.7–4.0)
Lymphocytes Relative: 31.5 % (ref 12.0–46.0)
MCHC: 33.2 g/dL (ref 30.0–36.0)
MCV: 92.6 fl (ref 78.0–100.0)
MONO ABS: 0.5 10*3/uL (ref 0.1–1.0)
Monocytes Relative: 8.8 % (ref 3.0–12.0)
NEUTROS PCT: 58.2 % (ref 43.0–77.0)
Neutro Abs: 3.5 10*3/uL (ref 1.4–7.7)
Platelets: 326 10*3/uL (ref 150.0–400.0)
RBC: 4.43 Mil/uL (ref 4.22–5.81)
RDW: 13.1 % (ref 11.5–15.5)
WBC: 6.1 10*3/uL (ref 4.0–10.5)

## 2017-04-22 LAB — PSA: PSA: 5.17 ng/mL — AB (ref 0.10–4.00)

## 2017-04-22 LAB — TSH: TSH: 1.46 u[IU]/mL (ref 0.35–4.50)

## 2017-04-22 NOTE — Patient Instructions (Addendum)
Limit your sodium (Salt) intake  Please check your blood pressure on a regular basis.  If it is consistently greater than 150/90, please make an office appointment.  Return in one year for follow-up   DASH Eating Plan DASH stands for "Dietary Approaches to Stop Hypertension." The DASH eating plan is a healthy eating plan that has been shown to reduce high blood pressure (hypertension). It may also reduce your risk for type 2 diabetes, heart disease, and stroke. The DASH eating plan may also help with weight loss. What are tips for following this plan? General guidelines  Avoid eating more than 2,300 mg (milligrams) of salt (sodium) a day. If you have hypertension, you may need to reduce your sodium intake to 1,500 mg a day.  Limit alcohol intake to no more than 1 drink a day for nonpregnant women and 2 drinks a day for men. One drink equals 12 oz of beer, 5 oz of wine, or 1 oz of hard liquor.  Work with your health care provider to maintain a healthy body weight or to lose weight. Ask what an ideal weight is for you.  Get at least 30 minutes of exercise that causes your heart to beat faster (aerobic exercise) most days of the week. Activities may include walking, swimming, or biking.  Work with your health care provider or diet and nutrition specialist (dietitian) to adjust your eating plan to your individual calorie needs. Reading food labels  Check food labels for the amount of sodium per serving. Choose foods with less than 5 percent of the Daily Value of sodium. Generally, foods with less than 300 mg of sodium per serving fit into this eating plan.  To find whole grains, look for the word "whole" as the first word in the ingredient list. Shopping  Buy products labeled as "low-sodium" or "no salt added."  Buy fresh foods. Avoid canned foods and premade or frozen meals. Cooking  Avoid adding salt when cooking. Use salt-free seasonings or herbs instead of table salt or sea salt.  Check with your health care provider or pharmacist before using salt substitutes.  Do not fry foods. Cook foods using healthy methods such as baking, boiling, grilling, and broiling instead.  Cook with heart-healthy oils, such as olive, canola, soybean, or sunflower oil. Meal planning   Eat a balanced diet that includes: ? 5 or more servings of fruits and vegetables each day. At each meal, try to fill half of your plate with fruits and vegetables. ? Up to 6-8 servings of whole grains each day. ? Less than 6 oz of lean meat, poultry, or fish each day. A 3-oz serving of meat is about the same size as a deck of cards. One egg equals 1 oz. ? 2 servings of low-fat dairy each day. ? A serving of nuts, seeds, or beans 5 times each week. ? Heart-healthy fats. Healthy fats called Omega-3 fatty acids are found in foods such as flaxseeds and coldwater fish, like sardines, salmon, and mackerel.  Limit how much you eat of the following: ? Canned or prepackaged foods. ? Food that is high in trans fat, such as fried foods. ? Food that is high in saturated fat, such as fatty meat. ? Sweets, desserts, sugary drinks, and other foods with added sugar. ? Full-fat dairy products.  Do not salt foods before eating.  Try to eat at least 2 vegetarian meals each week.  Eat more home-cooked food and less restaurant, buffet, and fast food.  When  eating at a restaurant, ask that your food be prepared with less salt or no salt, if possible. What foods are recommended? The items listed may not be a complete list. Talk with your dietitian about what dietary choices are best for you. Grains Whole-grain or whole-wheat bread. Whole-grain or whole-wheat pasta. Brown rice. [place]. Bulgur. Whole-grain and low-sodium cereals. Pita bread. Low-fat, low-sodium crackers. Whole-wheat flour tortillas. Vegetables Fresh or frozen vegetables (raw, steamed, roasted, or grilled). Low-sodium or reduced-sodium tomato and  vegetable juice. Low-sodium or reduced-sodium tomato sauce and tomato paste. Low-sodium or reduced-sodium canned vegetables. Fruits All fresh, dried, or frozen fruit. Canned fruit in natural juice (without added sugar). Meat and other protein foods Skinless chicken or [place]. Ground chicken or [place]. Pork with fat trimmed off. Fish and seafood. Egg whites. Dried beans, peas, or lentils. Unsalted nuts, nut butters, and seeds. Unsalted canned beans. Lean cuts of beef with fat trimmed off. Low-sodium, lean deli meat. Dairy Low-fat (1%) or fat-free (skim) milk. Fat-free, low-fat, or reduced-fat cheeses. Nonfat, low-sodium ricotta or cottage cheese. Low-fat or nonfat yogurt. Low-fat, low-sodium cheese. Fats and oils Soft margarine without trans fats. Vegetable oil. Low-fat, reduced-fat, or light mayonnaise and salad dressings (reduced-sodium). Canola, safflower, olive, soybean, and sunflower oils. Avocado. Seasoning and other foods Herbs. Spices. Seasoning mixes without salt. Unsalted popcorn and pretzels. Fat-free sweets. What foods are not recommended? The items listed may not be a complete list. Talk with your dietitian about what dietary choices are best for you. Grains Baked goods made with fat, such as croissants, muffins, or some breads. Dry pasta or rice meal packs. Vegetables Creamed or fried vegetables. Vegetables in a cheese sauce. Regular canned vegetables (not low-sodium or reduced-sodium). Regular canned tomato sauce and paste (not low-sodium or reduced-sodium). Regular tomato and vegetable juice (not low-sodium or reduced-sodium). Angie Fava. Olives. Fruits Canned fruit in a light or heavy syrup. Fried fruit. Fruit in cream or butter sauce. Meat and other protein foods Fatty cuts of meat. Ribs. Fried meat. Berniece Salines. Sausage. Bologna and other processed lunch meats. Salami. Fatback. Hotdogs. Bratwurst. Salted nuts and seeds. Canned beans with added salt. Canned or smoked fish. Whole eggs or  egg yolks. Chicken or [place] with skin. Dairy Whole or 2% milk, cream, and half-and-half. Whole or full-fat cream cheese. Whole-fat or sweetened yogurt. Full-fat cheese. Nondairy creamers. Whipped toppings. Processed cheese and cheese spreads. Fats and oils Butter. Stick margarine. Lard. Shortening. Ghee. Bacon fat. Tropical oils, such as coconut, palm kernel, or palm oil. Seasoning and other foods Salted popcorn and pretzels. Onion salt, garlic salt, seasoned salt, table salt, and sea salt. Worcestershire sauce. Tartar sauce. Barbecue sauce. Teriyaki sauce. Soy sauce, including reduced-sodium. Steak sauce. Canned and packaged gravies. Fish sauce. Oyster sauce. Cocktail sauce. Horseradish that you find on the shelf. Ketchup. Mustard. Meat flavorings and tenderizers. Bouillon cubes. Hot sauce and Tabasco sauce. Premade or packaged marinades. Premade or packaged taco seasonings. Relishes. Regular salad dressings. Where to find more information:  National Heart, Lung, and [place]: https://wilson-eaton.com/  American Heart Association: www.heart.org Summary  The DASH eating plan is a healthy eating plan that has been shown to reduce high blood pressure (hypertension). It may also reduce your risk for type 2 diabetes, heart disease, and stroke.  With the DASH eating plan, you should limit salt (sodium) intake to 2,300 mg a day. If you have hypertension, you may need to reduce your sodium intake to 1,500 mg a day.  When on the DASH eating plan, aim  to eat more fresh fruits and vegetables, whole grains, lean proteins, low-fat dairy, and heart-healthy fats.  Work with your health care provider or diet and nutrition specialist (dietitian) to adjust your eating plan to your individual calorie needs. This information is not intended to replace advice given to you by your health care provider. Make sure you discuss any questions you have with your health care provider. Document Released: 01/31/2011  Document Revised: 02/05/2016 Document Reviewed: 02/05/2016 Elsevier Interactive Patient Education  [employer].

## 2017-04-22 NOTE — Progress Notes (Signed)
Subjective:    Patient ID: Jesse Horne, male    DOB: [birth year], 57 y.o.   MRN: [id number]  HPI  57 year old patient who is seen today for a preventive health exam He has a history of essential hypertension he also is on statin therapy for dyslipidemia. Remains quite well.  He is quite active physically. Does have a home gym and exercises regularly  Last colonoscopy 2014  Family history noncontributory.  Father died at 53 mother died at 17.  2 sisters one with a history of breast cancer  Past Medical History:  Diagnosis Date  . At risk for sleep apnea    STOP-BANG= 4    SENT TO PCP 12-24-2013  . Family history of anesthesia complication    SISTER--  PONV  . Hyperlipidemia   . Internal hemorrhoid, bleeding   . Seasonal and perennial allergic rhinitis   . Wears glasses      Social History   Socioeconomic History  . Marital status: Single    Spouse name: Not on file  . Number of children: Not on file  . Years of education: Not on file  . Highest education level: Not on file  Social Needs  . Financial resource strain: Not on file  . Food insecurity - worry: Not on file  . Food insecurity - inability: Not on file  . Transportation needs - medical: Not on file  . Transportation needs - non-medical: Not on file  Occupational History  . Not on file  Tobacco Use  . Smoking status: Never Smoker  . Smokeless tobacco: Never Used  Substance and Sexual Activity  . Alcohol use: Yes    Alcohol/week: 1.2 oz    Types: 2 Standard drinks or equivalent per week  . Drug use: No  . Sexual activity: Not on file  Other Topics Concern  . Not on file  Social History Narrative  . Not on file    Past Surgical History:  Procedure Laterality Date  . COLONOSCOPY N/A 07/29/2012   Procedure: COLONOSCOPY;  Surgeon: Romie Levee, MD;  Location: [facility];  Service: Endoscopy;  Laterality: N/A;   . TRANSANAL HEMORRHOIDAL DEARTERIALIZATION N/A 12/29/2013   Procedure: TRANSANAL  HEMORRHOIDAL DEARTERIALIZATION;  Surgeon: Romie Levee, MD;  Location: [facility];  Service: General;  Laterality: N/A;  . TRANSTHORACIC ECHOCARDIOGRAM  03-17-2008   mild focal septal hypertrophy/  ef 60%/  trivial TR    Family History  Problem Relation Age of Onset  . Cancer Mother        Melanoma, Basal Cell Carcinoma, Breast  . Heart disease Father   . Cancer Sister        Breast  . Heart disease Sister     No Known Allergies  Current Outpatient Medications on File Prior to Visit  Medication Sig Dispense Refill  . atorvastatin (LIPITOR) 20 MG tablet Take 1 tablet (20 mg total) by mouth daily. 90 tablet 2  . azelastine (ASTELIN) 0.1 % nasal spray Place 1 spray into both nostrils 2 (two) times daily.  3  . DYMISTA 137-50 MCG/ACT SUSP SPRAY 1 SPRAY INTO EACH NOSTRIL TWICE A DAY  3  . EPIPEN 2-PAK 0.3 MG/0.3ML SOAJ as needed.     Marland Kitchen levocetirizine (XYZAL) 5 MG tablet Take 5 mg by mouth every morning.     . mometasone (NASONEX) 50 MCG/ACT nasal spray Place 2 sprays into the nose as needed.    . montelukast (SINGULAIR) 10 MG tablet Take 10 mg by  mouth daily.  3  . Psyllium-Calcium (METAMUCIL PLUS CALCIUM) CAPS Take 8 capsules by mouth daily.    . valsartan-hydrochlorothiazide (DIOVAN-HCT) 160-12.5 MG tablet Take 1 tablet by mouth daily. 90 tablet 5  . vitamin C (ASCORBIC ACID) 500 MG tablet Take 500 mg by mouth daily.    . vitamin E 400 UNIT capsule Take 400 Units by mouth daily.     No current facility-administered medications on file prior to visit.     BP 120/84   Pulse 81   Temp 99 F (37.2 C) (Oral)   Ht 5\' 7"  (1.702 m)   Wt 173 lb (78.5 kg)   SpO2 98%   BMI 27.10 kg/m     Review of Systems  Constitutional: Negative for appetite change, chills, fatigue and fever.  HENT: Negative for congestion, dental problem, ear pain, hearing loss, sore throat, tinnitus, trouble swallowing and voice change.   Eyes: Negative for pain, discharge and visual  disturbance.  Respiratory: Negative for cough, chest tightness, wheezing and stridor.   Cardiovascular: Negative for chest pain, palpitations and leg swelling.  Gastrointestinal: Negative for abdominal distention, abdominal pain, blood in stool, constipation, diarrhea, nausea and vomiting.  Genitourinary: Negative for difficulty urinating, discharge, flank pain, genital sores, hematuria and urgency.  Musculoskeletal: Negative for arthralgias, back pain, gait problem, joint swelling, myalgias and neck stiffness.  Skin: Negative for rash.  Neurological: Negative for dizziness, syncope, speech difficulty, weakness, numbness and headaches.  Hematological: Negative for adenopathy. Does not bruise/bleed easily.  Psychiatric/Behavioral: Negative for behavioral problems and dysphoric mood. The patient is not nervous/anxious.        Objective:   Physical Exam  Constitutional: He appears well-developed and well-nourished.  HENT:  Head: Normocephalic and atraumatic.  Right Ear: External ear normal.  Left Ear: External ear normal.  Nose: Nose normal.  Mouth/Throat: Oropharynx is clear and moist.  Eyes: Conjunctivae and EOM are normal. Pupils are equal, round, and reactive to light. No scleral icterus.  Neck: Normal range of motion. Neck supple. No JVD present. No thyromegaly present.  Cardiovascular: Regular rhythm, normal heart sounds and intact distal pulses. Exam reveals no gallop and no friction rub.  No murmur heard. Pulmonary/Chest: Effort normal and breath sounds normal. He exhibits no tenderness.  Abdominal: Soft. Bowel sounds are normal. He exhibits no distension and no mass. There is no tenderness.  Genitourinary: Prostate normal and penis normal. Rectal exam shows guaiac negative stool.  Genitourinary Comments: Hemorrhoidal tags  Musculoskeletal: Normal range of motion. He exhibits no edema or tenderness.  Lymphadenopathy:    He has no cervical adenopathy.  Neurological: He is alert.  He has normal reflexes. No cranial nerve deficit. Coordination normal.  Skin: Skin is warm and dry. No rash noted.  Psychiatric: He has a normal mood and affect. His behavior is normal.          Assessment & Plan:   Preventive health exam Rule out impaired glucose tolerance History of increasing PSA.  Will review  allergic rhinitis.  Continue present regimen Continue present regimen  Rogelia BogaKWIATKOWSKI,Christan Defranco FRANK

## 2017-04-22 NOTE — Addendum Note (Signed)
Addended by: Waymon AmatoJOHNSON, KENDRA R on: 04/22/2017 04:43 PM   Modules accepted: Orders

## 2017-05-02 DIAGNOSIS — J3089 Other allergic rhinitis: Secondary | ICD-10-CM | POA: Diagnosis not present

## 2017-05-02 DIAGNOSIS — J3081 Allergic rhinitis due to animal (cat) (dog) hair and dander: Secondary | ICD-10-CM | POA: Diagnosis not present

## 2017-05-02 DIAGNOSIS — J301 Allergic rhinitis due to pollen: Secondary | ICD-10-CM | POA: Diagnosis not present

## 2017-05-16 DIAGNOSIS — J3089 Other allergic rhinitis: Secondary | ICD-10-CM | POA: Diagnosis not present

## 2017-05-16 DIAGNOSIS — J3081 Allergic rhinitis due to animal (cat) (dog) hair and dander: Secondary | ICD-10-CM | POA: Diagnosis not present

## 2017-05-16 DIAGNOSIS — J301 Allergic rhinitis due to pollen: Secondary | ICD-10-CM | POA: Diagnosis not present

## 2017-05-30 DIAGNOSIS — J301 Allergic rhinitis due to pollen: Secondary | ICD-10-CM | POA: Diagnosis not present

## 2017-05-30 DIAGNOSIS — J3089 Other allergic rhinitis: Secondary | ICD-10-CM | POA: Diagnosis not present

## 2017-05-30 DIAGNOSIS — J3081 Allergic rhinitis due to animal (cat) (dog) hair and dander: Secondary | ICD-10-CM | POA: Diagnosis not present

## 2017-05-31 ENCOUNTER — Other Ambulatory Visit: Payer: Self-pay | Admitting: Internal Medicine

## 2017-06-03 DIAGNOSIS — R972 Elevated prostate specific antigen [PSA]: Secondary | ICD-10-CM | POA: Diagnosis not present

## 2017-06-03 DIAGNOSIS — M722 Plantar fascial fibromatosis: Secondary | ICD-10-CM | POA: Diagnosis not present

## 2017-06-03 DIAGNOSIS — M79671 Pain in right foot: Secondary | ICD-10-CM | POA: Diagnosis not present

## 2017-06-03 DIAGNOSIS — R2241 Localized swelling, mass and lump, right lower limb: Secondary | ICD-10-CM | POA: Diagnosis not present

## 2017-06-26 DIAGNOSIS — J301 Allergic rhinitis due to pollen: Secondary | ICD-10-CM | POA: Diagnosis not present

## 2017-06-26 DIAGNOSIS — J3089 Other allergic rhinitis: Secondary | ICD-10-CM | POA: Diagnosis not present

## 2017-06-26 DIAGNOSIS — J3081 Allergic rhinitis due to animal (cat) (dog) hair and dander: Secondary | ICD-10-CM | POA: Diagnosis not present

## 2017-06-30 DIAGNOSIS — J3081 Allergic rhinitis due to animal (cat) (dog) hair and dander: Secondary | ICD-10-CM | POA: Diagnosis not present

## 2017-06-30 DIAGNOSIS — J301 Allergic rhinitis due to pollen: Secondary | ICD-10-CM | POA: Diagnosis not present

## 2017-07-01 DIAGNOSIS — J3081 Allergic rhinitis due to animal (cat) (dog) hair and dander: Secondary | ICD-10-CM | POA: Diagnosis not present

## 2017-07-01 DIAGNOSIS — J3089 Other allergic rhinitis: Secondary | ICD-10-CM | POA: Diagnosis not present

## 2017-07-08 DIAGNOSIS — B9689 Other specified bacterial agents as the cause of diseases classified elsewhere: Secondary | ICD-10-CM | POA: Diagnosis not present

## 2017-07-08 DIAGNOSIS — D2262 Melanocytic nevi of left upper limb, including shoulder: Secondary | ICD-10-CM | POA: Diagnosis not present

## 2017-07-08 DIAGNOSIS — L02821 Furuncle of head [any part, except face]: Secondary | ICD-10-CM | POA: Diagnosis not present

## 2017-07-08 DIAGNOSIS — D485 Neoplasm of uncertain behavior of skin: Secondary | ICD-10-CM | POA: Diagnosis not present

## 2017-07-08 DIAGNOSIS — Z1283 Encounter for screening for malignant neoplasm of skin: Secondary | ICD-10-CM | POA: Diagnosis not present

## 2017-07-08 DIAGNOSIS — D225 Melanocytic nevi of trunk: Secondary | ICD-10-CM | POA: Diagnosis not present

## 2017-07-11 DIAGNOSIS — J3089 Other allergic rhinitis: Secondary | ICD-10-CM | POA: Diagnosis not present

## 2017-07-11 DIAGNOSIS — J301 Allergic rhinitis due to pollen: Secondary | ICD-10-CM | POA: Diagnosis not present

## 2017-07-25 DIAGNOSIS — J301 Allergic rhinitis due to pollen: Secondary | ICD-10-CM | POA: Diagnosis not present

## 2017-07-25 DIAGNOSIS — J3089 Other allergic rhinitis: Secondary | ICD-10-CM | POA: Diagnosis not present

## 2017-07-25 DIAGNOSIS — J3081 Allergic rhinitis due to animal (cat) (dog) hair and dander: Secondary | ICD-10-CM | POA: Diagnosis not present

## 2017-07-30 DIAGNOSIS — J3089 Other allergic rhinitis: Secondary | ICD-10-CM | POA: Diagnosis not present

## 2017-07-30 DIAGNOSIS — J3081 Allergic rhinitis due to animal (cat) (dog) hair and dander: Secondary | ICD-10-CM | POA: Diagnosis not present

## 2017-07-30 DIAGNOSIS — J301 Allergic rhinitis due to pollen: Secondary | ICD-10-CM | POA: Diagnosis not present

## 2017-08-01 DIAGNOSIS — F419 Anxiety disorder, unspecified: Secondary | ICD-10-CM | POA: Diagnosis not present

## 2017-08-05 DIAGNOSIS — J301 Allergic rhinitis due to pollen: Secondary | ICD-10-CM | POA: Diagnosis not present

## 2017-08-05 DIAGNOSIS — J3089 Other allergic rhinitis: Secondary | ICD-10-CM | POA: Diagnosis not present

## 2017-08-05 DIAGNOSIS — J3081 Allergic rhinitis due to animal (cat) (dog) hair and dander: Secondary | ICD-10-CM | POA: Diagnosis not present

## 2017-08-14 DIAGNOSIS — F419 Anxiety disorder, unspecified: Secondary | ICD-10-CM | POA: Diagnosis not present

## 2017-08-15 DIAGNOSIS — J301 Allergic rhinitis due to pollen: Secondary | ICD-10-CM | POA: Diagnosis not present

## 2017-08-15 DIAGNOSIS — J3081 Allergic rhinitis due to animal (cat) (dog) hair and dander: Secondary | ICD-10-CM | POA: Diagnosis not present

## 2017-08-15 DIAGNOSIS — J3089 Other allergic rhinitis: Secondary | ICD-10-CM | POA: Diagnosis not present

## 2017-08-21 DIAGNOSIS — J3089 Other allergic rhinitis: Secondary | ICD-10-CM | POA: Diagnosis not present

## 2017-08-21 DIAGNOSIS — R2241 Localized swelling, mass and lump, right lower limb: Secondary | ICD-10-CM | POA: Diagnosis not present

## 2017-08-21 DIAGNOSIS — J3081 Allergic rhinitis due to animal (cat) (dog) hair and dander: Secondary | ICD-10-CM | POA: Diagnosis not present

## 2017-08-21 DIAGNOSIS — J301 Allergic rhinitis due to pollen: Secondary | ICD-10-CM | POA: Diagnosis not present

## 2017-08-21 DIAGNOSIS — M722 Plantar fascial fibromatosis: Secondary | ICD-10-CM | POA: Diagnosis not present

## 2017-08-26 DIAGNOSIS — J301 Allergic rhinitis due to pollen: Secondary | ICD-10-CM | POA: Diagnosis not present

## 2017-08-26 DIAGNOSIS — F419 Anxiety disorder, unspecified: Secondary | ICD-10-CM | POA: Diagnosis not present

## 2017-08-26 DIAGNOSIS — J3089 Other allergic rhinitis: Secondary | ICD-10-CM | POA: Diagnosis not present

## 2017-08-26 DIAGNOSIS — J3081 Allergic rhinitis due to animal (cat) (dog) hair and dander: Secondary | ICD-10-CM | POA: Diagnosis not present

## 2017-09-03 DIAGNOSIS — R972 Elevated prostate specific antigen [PSA]: Secondary | ICD-10-CM | POA: Diagnosis not present

## 2017-09-04 DIAGNOSIS — J3089 Other allergic rhinitis: Secondary | ICD-10-CM | POA: Diagnosis not present

## 2017-09-04 DIAGNOSIS — J3081 Allergic rhinitis due to animal (cat) (dog) hair and dander: Secondary | ICD-10-CM | POA: Diagnosis not present

## 2017-09-04 DIAGNOSIS — J301 Allergic rhinitis due to pollen: Secondary | ICD-10-CM | POA: Diagnosis not present

## 2017-09-11 DIAGNOSIS — J301 Allergic rhinitis due to pollen: Secondary | ICD-10-CM | POA: Diagnosis not present

## 2017-09-11 DIAGNOSIS — J3089 Other allergic rhinitis: Secondary | ICD-10-CM | POA: Diagnosis not present

## 2017-09-11 DIAGNOSIS — J3081 Allergic rhinitis due to animal (cat) (dog) hair and dander: Secondary | ICD-10-CM | POA: Diagnosis not present

## 2017-09-16 DIAGNOSIS — R972 Elevated prostate specific antigen [PSA]: Secondary | ICD-10-CM | POA: Diagnosis not present

## 2017-09-19 DIAGNOSIS — J301 Allergic rhinitis due to pollen: Secondary | ICD-10-CM | POA: Diagnosis not present

## 2017-09-19 DIAGNOSIS — J3089 Other allergic rhinitis: Secondary | ICD-10-CM | POA: Diagnosis not present

## 2017-09-19 DIAGNOSIS — J3081 Allergic rhinitis due to animal (cat) (dog) hair and dander: Secondary | ICD-10-CM | POA: Diagnosis not present

## 2017-09-24 DIAGNOSIS — J3081 Allergic rhinitis due to animal (cat) (dog) hair and dander: Secondary | ICD-10-CM | POA: Diagnosis not present

## 2017-09-24 DIAGNOSIS — J301 Allergic rhinitis due to pollen: Secondary | ICD-10-CM | POA: Diagnosis not present

## 2017-09-24 DIAGNOSIS — J3089 Other allergic rhinitis: Secondary | ICD-10-CM | POA: Diagnosis not present

## 2017-10-01 DIAGNOSIS — J301 Allergic rhinitis due to pollen: Secondary | ICD-10-CM | POA: Diagnosis not present

## 2017-10-01 DIAGNOSIS — J3081 Allergic rhinitis due to animal (cat) (dog) hair and dander: Secondary | ICD-10-CM | POA: Diagnosis not present

## 2017-10-01 DIAGNOSIS — J3089 Other allergic rhinitis: Secondary | ICD-10-CM | POA: Diagnosis not present

## 2017-10-03 DIAGNOSIS — J301 Allergic rhinitis due to pollen: Secondary | ICD-10-CM | POA: Diagnosis not present

## 2017-10-03 DIAGNOSIS — J3081 Allergic rhinitis due to animal (cat) (dog) hair and dander: Secondary | ICD-10-CM | POA: Diagnosis not present

## 2017-10-08 DIAGNOSIS — J301 Allergic rhinitis due to pollen: Secondary | ICD-10-CM | POA: Diagnosis not present

## 2017-10-08 DIAGNOSIS — J3089 Other allergic rhinitis: Secondary | ICD-10-CM | POA: Diagnosis not present

## 2017-10-08 DIAGNOSIS — J3081 Allergic rhinitis due to animal (cat) (dog) hair and dander: Secondary | ICD-10-CM | POA: Diagnosis not present

## 2017-10-10 DIAGNOSIS — J3089 Other allergic rhinitis: Secondary | ICD-10-CM | POA: Diagnosis not present

## 2017-10-10 DIAGNOSIS — J3081 Allergic rhinitis due to animal (cat) (dog) hair and dander: Secondary | ICD-10-CM | POA: Diagnosis not present

## 2017-10-10 DIAGNOSIS — J301 Allergic rhinitis due to pollen: Secondary | ICD-10-CM | POA: Diagnosis not present

## 2017-10-16 DIAGNOSIS — J3089 Other allergic rhinitis: Secondary | ICD-10-CM | POA: Diagnosis not present

## 2017-10-16 DIAGNOSIS — J301 Allergic rhinitis due to pollen: Secondary | ICD-10-CM | POA: Diagnosis not present

## 2017-10-16 DIAGNOSIS — J3081 Allergic rhinitis due to animal (cat) (dog) hair and dander: Secondary | ICD-10-CM | POA: Diagnosis not present

## 2017-10-23 DIAGNOSIS — J3081 Allergic rhinitis due to animal (cat) (dog) hair and dander: Secondary | ICD-10-CM | POA: Diagnosis not present

## 2017-10-23 DIAGNOSIS — J3089 Other allergic rhinitis: Secondary | ICD-10-CM | POA: Diagnosis not present

## 2017-10-23 DIAGNOSIS — J301 Allergic rhinitis due to pollen: Secondary | ICD-10-CM | POA: Diagnosis not present

## 2017-10-31 DIAGNOSIS — J301 Allergic rhinitis due to pollen: Secondary | ICD-10-CM | POA: Diagnosis not present

## 2017-10-31 DIAGNOSIS — J3089 Other allergic rhinitis: Secondary | ICD-10-CM | POA: Diagnosis not present

## 2017-10-31 DIAGNOSIS — J3081 Allergic rhinitis due to animal (cat) (dog) hair and dander: Secondary | ICD-10-CM | POA: Diagnosis not present

## 2017-11-07 DIAGNOSIS — J3089 Other allergic rhinitis: Secondary | ICD-10-CM | POA: Diagnosis not present

## 2017-11-07 DIAGNOSIS — J301 Allergic rhinitis due to pollen: Secondary | ICD-10-CM | POA: Diagnosis not present

## 2017-11-07 DIAGNOSIS — J3081 Allergic rhinitis due to animal (cat) (dog) hair and dander: Secondary | ICD-10-CM | POA: Diagnosis not present

## 2017-11-13 DIAGNOSIS — L648 Other androgenic alopecia: Secondary | ICD-10-CM | POA: Diagnosis not present

## 2017-11-13 DIAGNOSIS — L905 Scar conditions and fibrosis of skin: Secondary | ICD-10-CM | POA: Diagnosis not present

## 2017-11-13 DIAGNOSIS — J301 Allergic rhinitis due to pollen: Secondary | ICD-10-CM | POA: Diagnosis not present

## 2017-11-13 DIAGNOSIS — J3081 Allergic rhinitis due to animal (cat) (dog) hair and dander: Secondary | ICD-10-CM | POA: Diagnosis not present

## 2017-11-13 DIAGNOSIS — J3089 Other allergic rhinitis: Secondary | ICD-10-CM | POA: Diagnosis not present

## 2017-11-18 DIAGNOSIS — J301 Allergic rhinitis due to pollen: Secondary | ICD-10-CM | POA: Diagnosis not present

## 2017-11-18 DIAGNOSIS — J3081 Allergic rhinitis due to animal (cat) (dog) hair and dander: Secondary | ICD-10-CM | POA: Diagnosis not present

## 2017-11-18 DIAGNOSIS — J3089 Other allergic rhinitis: Secondary | ICD-10-CM | POA: Diagnosis not present

## 2017-11-28 DIAGNOSIS — J3081 Allergic rhinitis due to animal (cat) (dog) hair and dander: Secondary | ICD-10-CM | POA: Diagnosis not present

## 2017-11-28 DIAGNOSIS — J301 Allergic rhinitis due to pollen: Secondary | ICD-10-CM | POA: Diagnosis not present

## 2017-11-28 DIAGNOSIS — J3089 Other allergic rhinitis: Secondary | ICD-10-CM | POA: Diagnosis not present

## 2017-12-04 DIAGNOSIS — F4322 Adjustment disorder with anxiety: Secondary | ICD-10-CM | POA: Diagnosis not present

## 2017-12-05 DIAGNOSIS — J3081 Allergic rhinitis due to animal (cat) (dog) hair and dander: Secondary | ICD-10-CM | POA: Diagnosis not present

## 2017-12-05 DIAGNOSIS — J301 Allergic rhinitis due to pollen: Secondary | ICD-10-CM | POA: Diagnosis not present

## 2017-12-05 DIAGNOSIS — J3089 Other allergic rhinitis: Secondary | ICD-10-CM | POA: Diagnosis not present

## 2017-12-11 DIAGNOSIS — J3089 Other allergic rhinitis: Secondary | ICD-10-CM | POA: Diagnosis not present

## 2017-12-11 DIAGNOSIS — J3081 Allergic rhinitis due to animal (cat) (dog) hair and dander: Secondary | ICD-10-CM | POA: Diagnosis not present

## 2017-12-11 DIAGNOSIS — J301 Allergic rhinitis due to pollen: Secondary | ICD-10-CM | POA: Diagnosis not present

## 2017-12-11 DIAGNOSIS — F4323 Adjustment disorder with mixed anxiety and depressed mood: Secondary | ICD-10-CM | POA: Diagnosis not present

## 2017-12-15 ENCOUNTER — Ambulatory Visit (INDEPENDENT_AMBULATORY_CARE_PROVIDER_SITE_OTHER): Payer: BLUE CROSS/BLUE SHIELD | Admitting: Family Medicine

## 2017-12-15 ENCOUNTER — Encounter: Payer: Self-pay | Admitting: Family Medicine

## 2017-12-15 VITALS — BP 120/78 | HR 90 | Temp 98.6°F | Wt 177.7 lb

## 2017-12-15 DIAGNOSIS — L0212 Furuncle of neck: Secondary | ICD-10-CM | POA: Diagnosis not present

## 2017-12-15 MED ORDER — CEPHALEXIN 500 MG PO CAPS: 500.0000 mg | ORAL_CAPSULE | Freq: Four times a day (QID) | ORAL | 1 refills | Status: DC

## 2017-12-15 NOTE — Patient Instructions (Signed)
Keflex 500 mg.............. 2 now........ then to twice daily until lesion resolves  Return as needed

## 2017-12-15 NOTE — Progress Notes (Signed)
Jesse Horne is a 57 year old male non-smoker comes in today for evaluation of a lump under his chin that he noticed this morning shaving  BP 120/78 (BP Location: Right Arm, Patient Position: Sitting, Cuff Size: Large)   Pulse 90   Temp 98.6 F (37 C) (Oral)   Wt 177 lb 11.2 oz (80.6 kg)   SpO2 98%   BMI 27.83 kg/m  Well-developed well-nourished male no acute distress vital signs stable is afebrile examination neck shows a pea-sized boil midline  1.  Boil......... hot soaks and Keflex return as needed

## 2017-12-18 DIAGNOSIS — F4323 Adjustment disorder with mixed anxiety and depressed mood: Secondary | ICD-10-CM | POA: Diagnosis not present

## 2017-12-19 DIAGNOSIS — J301 Allergic rhinitis due to pollen: Secondary | ICD-10-CM | POA: Diagnosis not present

## 2017-12-19 DIAGNOSIS — J3081 Allergic rhinitis due to animal (cat) (dog) hair and dander: Secondary | ICD-10-CM | POA: Diagnosis not present

## 2017-12-19 DIAGNOSIS — J3089 Other allergic rhinitis: Secondary | ICD-10-CM | POA: Diagnosis not present

## 2017-12-26 DIAGNOSIS — J301 Allergic rhinitis due to pollen: Secondary | ICD-10-CM | POA: Diagnosis not present

## 2017-12-26 DIAGNOSIS — J3089 Other allergic rhinitis: Secondary | ICD-10-CM | POA: Diagnosis not present

## 2017-12-26 DIAGNOSIS — J3081 Allergic rhinitis due to animal (cat) (dog) hair and dander: Secondary | ICD-10-CM | POA: Diagnosis not present

## 2017-12-31 DIAGNOSIS — F4321 Adjustment disorder with depressed mood: Secondary | ICD-10-CM | POA: Diagnosis not present

## 2018-01-01 DIAGNOSIS — J301 Allergic rhinitis due to pollen: Secondary | ICD-10-CM | POA: Diagnosis not present

## 2018-01-01 DIAGNOSIS — J3081 Allergic rhinitis due to animal (cat) (dog) hair and dander: Secondary | ICD-10-CM | POA: Diagnosis not present

## 2018-01-01 DIAGNOSIS — J3089 Other allergic rhinitis: Secondary | ICD-10-CM | POA: Diagnosis not present

## 2018-01-09 DIAGNOSIS — J301 Allergic rhinitis due to pollen: Secondary | ICD-10-CM | POA: Diagnosis not present

## 2018-01-09 DIAGNOSIS — J3089 Other allergic rhinitis: Secondary | ICD-10-CM | POA: Diagnosis not present

## 2018-01-09 DIAGNOSIS — J3081 Allergic rhinitis due to animal (cat) (dog) hair and dander: Secondary | ICD-10-CM | POA: Diagnosis not present

## 2018-01-21 DIAGNOSIS — J301 Allergic rhinitis due to pollen: Secondary | ICD-10-CM | POA: Diagnosis not present

## 2018-01-21 DIAGNOSIS — J3089 Other allergic rhinitis: Secondary | ICD-10-CM | POA: Diagnosis not present

## 2018-01-21 DIAGNOSIS — J3081 Allergic rhinitis due to animal (cat) (dog) hair and dander: Secondary | ICD-10-CM | POA: Diagnosis not present

## 2018-01-30 DIAGNOSIS — J301 Allergic rhinitis due to pollen: Secondary | ICD-10-CM | POA: Diagnosis not present

## 2018-01-30 DIAGNOSIS — T783XXD Angioneurotic edema, subsequent encounter: Secondary | ICD-10-CM | POA: Diagnosis not present

## 2018-01-30 DIAGNOSIS — J3089 Other allergic rhinitis: Secondary | ICD-10-CM | POA: Diagnosis not present

## 2018-01-30 DIAGNOSIS — J3081 Allergic rhinitis due to animal (cat) (dog) hair and dander: Secondary | ICD-10-CM | POA: Diagnosis not present

## 2018-02-02 DIAGNOSIS — J3081 Allergic rhinitis due to animal (cat) (dog) hair and dander: Secondary | ICD-10-CM | POA: Diagnosis not present

## 2018-02-02 DIAGNOSIS — J3089 Other allergic rhinitis: Secondary | ICD-10-CM | POA: Diagnosis not present

## 2018-02-05 DIAGNOSIS — J3081 Allergic rhinitis due to animal (cat) (dog) hair and dander: Secondary | ICD-10-CM | POA: Diagnosis not present

## 2018-02-05 DIAGNOSIS — J3089 Other allergic rhinitis: Secondary | ICD-10-CM | POA: Diagnosis not present

## 2018-02-05 DIAGNOSIS — J301 Allergic rhinitis due to pollen: Secondary | ICD-10-CM | POA: Diagnosis not present

## 2018-02-06 ENCOUNTER — Other Ambulatory Visit: Payer: Self-pay | Admitting: Internal Medicine

## 2018-02-13 ENCOUNTER — Ambulatory Visit (HOSPITAL_COMMUNITY)
Admission: RE | Admit: 2018-02-13 | Discharge: 2018-02-13 | Disposition: A | Discharge status: Home / Self Care | Payer: BLUE CROSS/BLUE SHIELD | Source: Ambulatory Visit | Attending: Internal Medicine | Admitting: Internal Medicine

## 2018-02-13 ENCOUNTER — Other Ambulatory Visit: Payer: Self-pay

## 2018-02-13 ENCOUNTER — Encounter (HOSPITAL_COMMUNITY): Payer: Self-pay | Admitting: Internal Medicine

## 2018-02-13 VITALS — BP 134/96 | HR 86 | Wt 179.4 lb

## 2018-02-13 DIAGNOSIS — E785 Hyperlipidemia, unspecified: Secondary | ICD-10-CM | POA: Insufficient documentation

## 2018-02-13 DIAGNOSIS — E782 Mixed hyperlipidemia: Secondary | ICD-10-CM

## 2018-02-13 DIAGNOSIS — Z136 Encounter for screening for cardiovascular disorders: Secondary | ICD-10-CM | POA: Diagnosis not present

## 2018-02-13 DIAGNOSIS — Z8249 Family history of ischemic heart disease and other diseases of the circulatory system: Secondary | ICD-10-CM | POA: Insufficient documentation

## 2018-02-13 DIAGNOSIS — I10 Essential (primary) hypertension: Secondary | ICD-10-CM | POA: Insufficient documentation

## 2018-02-13 DIAGNOSIS — Z79899 Other long term (current) drug therapy: Secondary | ICD-10-CM | POA: Diagnosis not present

## 2018-02-13 NOTE — Progress Notes (Signed)
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   Had ETT, January 2010. 13 minutes on [place]. Negative ECG.  ECHO 2010: EF 60%  Doing well. Exercises 2-3 week without problem. No CP or SOB. Not checking BP regularly.   Lipid Panel     Component Value Date/Time   CHOL 172 04/22/2017 1132   TRIG 150.0 (H) 04/22/2017 1132   HDL 58.10 04/22/2017 1132   CHOLHDL 3 04/22/2017 1132   VLDL 30.0 04/22/2017 1132   LDLCALC 84 04/22/2017 1132   LDLDIRECT 141.8 12/03/2012 [id number]    Father with CAD/MI in 8070s. Now 98.5 y/o (12/19)     SH:  Social History   Socioeconomic History  . Marital status: Single    Spouse name: Not on file  . Number of children: Not on file  . Years of education: Not on file  . Highest education level: Not on file  Occupational History  . Not on file  Social Needs  . Financial resource strain: Not on file  . Food insecurity:    Worry: Not on file    Inability: Not on file  . Transportation needs:    Medical: Not on file    Non-medical: Not on file  Tobacco Use  . Smoking status: Never Smoker  . Smokeless tobacco: Never Used  Substance and Sexual Activity  . Alcohol use: Yes    Alcohol/week: 2.0 standard drinks    Types: 2 Standard drinks or equivalent per week  . Drug use: No  . Sexual activity: Not on file  Lifestyle  . Physical activity:    Days per week: Not on file    Minutes per session: Not on file  . Stress: Not on file  Relationships  . Social connections:    Talks on phone: Not on file    Gets together: Not on file    Attends religious service: Not on file    Active member of club or organization: Not on file    Attends meetings of clubs or organizations: Not on file    Relationship status: Not on file  . Intimate partner violence:    Fear of current or ex partner: Not on file    Emotionally abused: Not on  file    Physically abused: Not on file    Forced sexual activity: Not on file  Other Topics Concern  . Not on file  Social History Narrative  . Not on file    FH:  Family History  Problem Relation Age of Onset  . Cancer Mother        Melanoma, Basal Cell Carcinoma, Breast  . Heart disease Father   . Cancer Sister        Breast  . Heart disease Sister     Past Medical History:  Diagnosis Date  . At risk for sleep apnea    STOP-BANG= 4    SENT TO PCP 12-24-2013  . Family history of anesthesia complication    SISTER--  PONV  . Hyperlipidemia   . Internal hemorrhoid, bleeding   . Seasonal and perennial allergic rhinitis   . Wears glasses     Current Outpatient Medications  Medication Sig Dispense Refill  . atorvastatin (LIPITOR) 20 MG tablet TAKE 1 TABLET BY MOUTH EVERY DAY 90 tablet 2  . valsartan-hydrochlorothiazide (DIOVAN-HCT) 160-12.5 MG tablet TAKE 1 TABLET BY  MOUTH DAILY. 90 tablet 2  . azelastine (ASTELIN) 0.1 % nasal spray Place 1 spray into both nostrils 2 (two) times daily.  3  . cephALEXin (KEFLEX) 500 MG capsule Take 1 capsule (500 mg total) by mouth 4 (four) times daily. 30 capsule 1  . DYMISTA 137-50 MCG/ACT SUSP SPRAY 1 SPRAY INTO EACH NOSTRIL TWICE A DAY  3  . EPIPEN 2-PAK 0.3 MG/0.3ML SOAJ as needed.     Marland Kitchen. levocetirizine (XYZAL) 5 MG tablet Take 5 mg by mouth every morning.     . mometasone (NASONEX) 50 MCG/ACT nasal spray Place 2 sprays into the nose as needed.    . montelukast (SINGULAIR) 10 MG tablet Take 10 mg by mouth daily.  3  . Psyllium-Calcium (METAMUCIL PLUS CALCIUM) CAPS Take 8 capsules by mouth daily.    . vitamin C (ASCORBIC ACID) 500 MG tablet Take 500 mg by mouth daily.    . vitamin E 400 UNIT capsule Take 400 Units by mouth daily.     No current facility-administered medications for this encounter.     Vitals:   02/13/18 0947  BP: (!) 134/96  Pulse: 86  SpO2: 98%  Weight: 81.4 kg (179 lb 6.4 oz)    PHYSICAL EXAM:  General:   Well appearing. No resp difficulty HEENT: normal Neck: supple. JVP flat. Carotids 2+ bilaterally; no bruits. No lymphadenopathy or thryomegaly appreciated. Cor: PMI normal. Regular rate & rhythm. No rubs, gallops or murmurs. Lungs: clear Abdomen: soft, nontender, nondistended. No hepatosplenomegaly. No bruits or masses. Good bowel sounds. Extremities: no cyanosis, clubbing, rash, edema Neuro: alert & orientedx3, cranial nerves grossly intact. Moves all 4 extremities w/o difficulty. Affect pleasant.   ECG: NSR 80 No ST-T wave abnormalities.    ASSESSMENT & PLAN: 1. HTN - BP mildly elevated. May need another agent.  - Asked him to limit salt and keep BP log and get back to me with it  2. HL - Followed by PCP - LDL ok. TGs slightly increased. Continue exercise. Keep weight down   3. CV risk - given FHX and CRFs will proceed with calcium scoring.   Arvilla Meresaniel Bensimhon, MD  3:16 PM

## 2018-02-13 NOTE — Patient Instructions (Addendum)
Your physician has requested that you regularly monitor and record your blood pressure readings at home. Please use the same machine at the same time of day to check your readings and record them to bring to your follow-up visit.  Your physician recommends that you have a Calcium Score CT. [facility] at [facility] at [facility] This procedure is not covered by insurance and will be out of pocket $150.   Follow up with Dr. Gala RomneyBensimhon in 1 year

## 2018-02-24 DIAGNOSIS — J301 Allergic rhinitis due to pollen: Secondary | ICD-10-CM | POA: Diagnosis not present

## 2018-02-24 DIAGNOSIS — J3081 Allergic rhinitis due to animal (cat) (dog) hair and dander: Secondary | ICD-10-CM | POA: Diagnosis not present

## 2018-02-24 DIAGNOSIS — J3089 Other allergic rhinitis: Secondary | ICD-10-CM | POA: Diagnosis not present

## 2018-03-04 DIAGNOSIS — J3089 Other allergic rhinitis: Secondary | ICD-10-CM | POA: Diagnosis not present

## 2018-03-04 DIAGNOSIS — J3081 Allergic rhinitis due to animal (cat) (dog) hair and dander: Secondary | ICD-10-CM | POA: Diagnosis not present

## 2018-03-04 DIAGNOSIS — J301 Allergic rhinitis due to pollen: Secondary | ICD-10-CM | POA: Diagnosis not present

## 2018-03-06 ENCOUNTER — Ambulatory Visit (INDEPENDENT_AMBULATORY_CARE_PROVIDER_SITE_OTHER)
Admission: RE | Admit: 2018-03-06 | Discharge: 2018-03-06 | Disposition: A | Discharge status: Home / Self Care | Payer: Self-pay | Source: Ambulatory Visit | Attending: Internal Medicine | Admitting: Internal Medicine

## 2018-03-06 DIAGNOSIS — J301 Allergic rhinitis due to pollen: Secondary | ICD-10-CM | POA: Diagnosis not present

## 2018-03-06 DIAGNOSIS — Z136 Encounter for screening for cardiovascular disorders: Secondary | ICD-10-CM

## 2018-03-06 DIAGNOSIS — J3081 Allergic rhinitis due to animal (cat) (dog) hair and dander: Secondary | ICD-10-CM | POA: Diagnosis not present

## 2018-03-06 DIAGNOSIS — J3089 Other allergic rhinitis: Secondary | ICD-10-CM | POA: Diagnosis not present

## 2018-03-09 NOTE — Telephone Encounter (Signed)
Requested medication (s) are due for refill today: yes  Requested medication (s) are on the active medication list: yes  Last refill:  06/02/17 for each medication with 2 refills  Future visit scheduled: yes  Notes to clinic:  Former pt of Dr, Amador Cunas. Cardiovascular: ARB + Diuretic combos failed. Cardiovascular : Antilipid - Statins failed. Last labs done > 180 days.  Requested Prescriptions  Pending Prescriptions Disp Refills   valsartan-hydrochlorothiazide (DIOVAN-HCT) 160-12.5 MG tablet [Pharmacy Med Name: VALSARTAN-HCTZ 160-12.5 MG TAB] 90 tablet 2    Sig: TAKE 1 TABLET BY MOUTH DAILY.     Cardiovascular: ARB + Diuretic Combos Failed - 03/09/2018  9:37 AM      Failed - K in normal range and within 180 days    Potassium  Date Value Ref Range Status  04/22/2017 3.9 3.5 - 5.1 mEq/L Final         Failed - Na in normal range and within 180 days    Sodium  Date Value Ref Range Status  04/22/2017 141 135 - 145 mEq/L Final  11/03/2015 143 137 - 147 mmol/L Final         Failed - Cr in normal range and within 180 days    Creatinine, Ser  Date Value Ref Range Status  04/22/2017 0.96 0.40 - 1.50 mg/dL Final         Failed - Ca in normal range and within 180 days    Calcium  Date Value Ref Range Status  04/22/2017 9.6 8.4 - 10.5 mg/dL Final         Failed - Last BP in normal range    BP Readings from Last 1 Encounters:  02/13/18 (!) 134/96         Passed - Patient is not pregnant      Passed - Valid encounter within last 6 months    Recent Outpatient Visits          2 months ago [facility], Eugenio Hoes, MD   10 months ago Visit for preventive health examination   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 [employer] [facility] [facility], Janett Labella, MD   1 year ago Essential hypertension   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 [employer], Janett Labella, MD   1 year ago Cough   [facility]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 [facility], Elberta Fortis, MD   2 years ago Encounter for  preventive health examination   [facility]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 [employer], Janett Labella, MD      Future Appointments            In 4 weeks Philip Aspen, Limmie Patricia, MD [facility] [place]          atorvastatin (LIPITOR) 20 MG tablet [Pharmacy Med Name: ATORVASTATIN 20 MG TABLET] 90 tablet 2    Sig: TAKE 1 TABLET BY MOUTH EVERY DAY     Cardiovascular:  Antilipid - Statins Failed - 03/09/2018  9:37 AM      Failed - Triglycerides in normal range and within 360 days    Triglycerides  Date Value Ref Range Status  04/22/2017 150.0 (H) 0.0 - 149.0 mg/dL Final    Comment:    Normal:  <150 mg/dLBorderline High:  150 - 199 mg/dL         Passed - Total Cholesterol in normal range and within 360 days    Cholesterol  Date Value Ref Range Status  04/22/2017 172 0 - 200 mg/dL Final    Comment:  ATP III Classification       Desirable:  < 200 mg/dL               Borderline High:  200 - 239 mg/dL          High:  > = 161240 mg/dL         Passed - LDL in normal range and within 360 days    LDL Cholesterol  Date Value Ref Range Status  04/22/2017 84 0 - 99 mg/dL Final         Passed - HDL in normal range and within 360 days    HDL  Date Value Ref Range Status  04/22/2017 58.10 >39.00 mg/dL Final         Passed - Patient is not pregnant      Passed - Valid encounter within last 12 months    Recent Outpatient Visits          2 months ago [facility] [employer] [facility], Eugenio HoesJeffrey A, MD   10 months ago Visit for preventive health examination   [facility], Janett LabellaPeter F, MD   1 year ago Essential hypertension   [facility], Janett LabellaPeter F, MD   1 year ago Cough   [facility], Elberta FortisBruce W, MD   2 years ago Encounter for preventive health examination   [facility], Janett LabellaPeter F, MD      Future Appointments            In 4 weeks Philip AspenHernandez  Acosta, Limmie PatriciaEstela Y, MD [facility]

## 2018-03-09 NOTE — Telephone Encounter (Signed)
Pt has an appt with dr Ardyth Harps on 04-07-2018

## 2018-03-11 DIAGNOSIS — I10 Essential (primary) hypertension: Secondary | ICD-10-CM | POA: Diagnosis not present

## 2018-03-11 DIAGNOSIS — J301 Allergic rhinitis due to pollen: Secondary | ICD-10-CM | POA: Diagnosis not present

## 2018-03-11 DIAGNOSIS — Z1331 Encounter for screening for depression: Secondary | ICD-10-CM | POA: Diagnosis not present

## 2018-03-11 DIAGNOSIS — J3081 Allergic rhinitis due to animal (cat) (dog) hair and dander: Secondary | ICD-10-CM | POA: Diagnosis not present

## 2018-03-11 DIAGNOSIS — N528 Other male erectile dysfunction: Secondary | ICD-10-CM | POA: Diagnosis not present

## 2018-03-11 DIAGNOSIS — J3089 Other allergic rhinitis: Secondary | ICD-10-CM | POA: Diagnosis not present

## 2018-03-11 DIAGNOSIS — E7849 Other hyperlipidemia: Secondary | ICD-10-CM | POA: Diagnosis not present

## 2018-03-13 DIAGNOSIS — J301 Allergic rhinitis due to pollen: Secondary | ICD-10-CM | POA: Diagnosis not present

## 2018-03-13 DIAGNOSIS — J3089 Other allergic rhinitis: Secondary | ICD-10-CM | POA: Diagnosis not present

## 2018-03-13 DIAGNOSIS — J3081 Allergic rhinitis due to animal (cat) (dog) hair and dander: Secondary | ICD-10-CM | POA: Diagnosis not present

## 2018-03-25 DIAGNOSIS — J3089 Other allergic rhinitis: Secondary | ICD-10-CM | POA: Diagnosis not present

## 2018-03-25 DIAGNOSIS — J3081 Allergic rhinitis due to animal (cat) (dog) hair and dander: Secondary | ICD-10-CM | POA: Diagnosis not present

## 2018-03-25 DIAGNOSIS — J301 Allergic rhinitis due to pollen: Secondary | ICD-10-CM | POA: Diagnosis not present

## 2018-03-27 DIAGNOSIS — J3081 Allergic rhinitis due to animal (cat) (dog) hair and dander: Secondary | ICD-10-CM | POA: Diagnosis not present

## 2018-03-27 DIAGNOSIS — J3089 Other allergic rhinitis: Secondary | ICD-10-CM | POA: Diagnosis not present

## 2018-03-27 DIAGNOSIS — J301 Allergic rhinitis due to pollen: Secondary | ICD-10-CM | POA: Diagnosis not present

## 2018-04-03 DIAGNOSIS — J3081 Allergic rhinitis due to animal (cat) (dog) hair and dander: Secondary | ICD-10-CM | POA: Diagnosis not present

## 2018-04-03 DIAGNOSIS — J3089 Other allergic rhinitis: Secondary | ICD-10-CM | POA: Diagnosis not present

## 2018-04-03 DIAGNOSIS — J301 Allergic rhinitis due to pollen: Secondary | ICD-10-CM | POA: Diagnosis not present

## 2018-04-07 ENCOUNTER — Encounter: Payer: BLUE CROSS/BLUE SHIELD | Admitting: Internal Medicine

## 2018-04-07 DIAGNOSIS — M545 Low back pain: Secondary | ICD-10-CM | POA: Diagnosis not present

## 2018-04-07 DIAGNOSIS — J111 Influenza due to unidentified influenza virus with other respiratory manifestations: Secondary | ICD-10-CM | POA: Diagnosis not present

## 2018-04-08 DIAGNOSIS — J3089 Other allergic rhinitis: Secondary | ICD-10-CM | POA: Diagnosis not present

## 2018-04-08 DIAGNOSIS — J301 Allergic rhinitis due to pollen: Secondary | ICD-10-CM | POA: Diagnosis not present

## 2018-04-08 DIAGNOSIS — J3081 Allergic rhinitis due to animal (cat) (dog) hair and dander: Secondary | ICD-10-CM | POA: Diagnosis not present

## 2018-04-17 DIAGNOSIS — J3089 Other allergic rhinitis: Secondary | ICD-10-CM | POA: Diagnosis not present

## 2018-04-17 DIAGNOSIS — J3081 Allergic rhinitis due to animal (cat) (dog) hair and dander: Secondary | ICD-10-CM | POA: Diagnosis not present

## 2018-04-17 DIAGNOSIS — J301 Allergic rhinitis due to pollen: Secondary | ICD-10-CM | POA: Diagnosis not present

## 2018-04-21 DIAGNOSIS — R972 Elevated prostate specific antigen [PSA]: Secondary | ICD-10-CM | POA: Diagnosis not present

## 2018-04-28 DIAGNOSIS — R972 Elevated prostate specific antigen [PSA]: Secondary | ICD-10-CM | POA: Diagnosis not present

## 2018-05-01 DIAGNOSIS — J3089 Other allergic rhinitis: Secondary | ICD-10-CM | POA: Diagnosis not present

## 2018-05-01 DIAGNOSIS — J301 Allergic rhinitis due to pollen: Secondary | ICD-10-CM | POA: Diagnosis not present

## 2018-05-01 DIAGNOSIS — J3081 Allergic rhinitis due to animal (cat) (dog) hair and dander: Secondary | ICD-10-CM | POA: Diagnosis not present

## 2018-05-08 DIAGNOSIS — J3081 Allergic rhinitis due to animal (cat) (dog) hair and dander: Secondary | ICD-10-CM | POA: Diagnosis not present

## 2018-05-08 DIAGNOSIS — J3089 Other allergic rhinitis: Secondary | ICD-10-CM | POA: Diagnosis not present

## 2018-05-08 DIAGNOSIS — J301 Allergic rhinitis due to pollen: Secondary | ICD-10-CM | POA: Diagnosis not present

## 2018-06-03 IMAGING — CT CT MAXILLOFACIAL W/O CM
1 series · 15 of 30 positions shown, 19 images · non-contrast
Comparison: Limited sinus CT 04/17/2009

CLINICAL DATA: Chronic sinusitis, unspecified location. Persistent
drainage and cough for many years.

EXAM:
CT MAXILLOFACIAL WITHOUT CONTRAST
TECHNIQUE: Multidetector CT images of the paranasal sinuses were obtained using
the standard protocol without intravenous contrast.

[Series 4: soft tissue · axial · 0.49mm/px · z∈[-230,-4]mm · 15 of 244 slices shown, 19 images]
[im 9/244  brain]
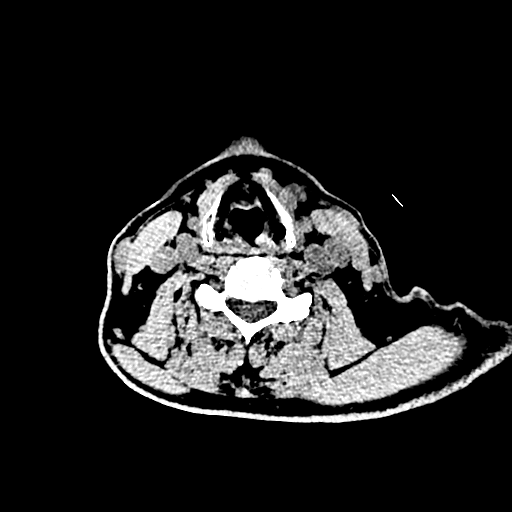
[im 9/244  bone]
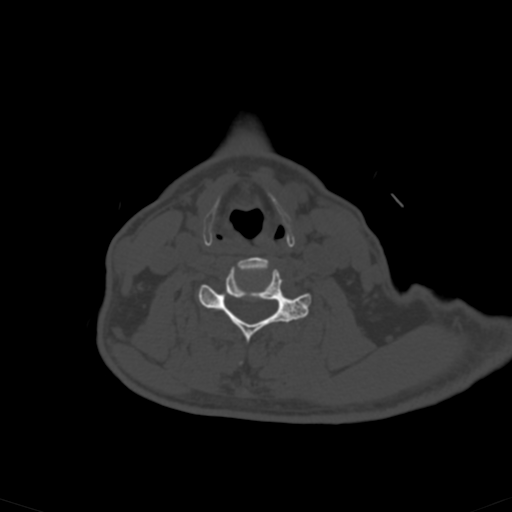
[im 26/244  bone]
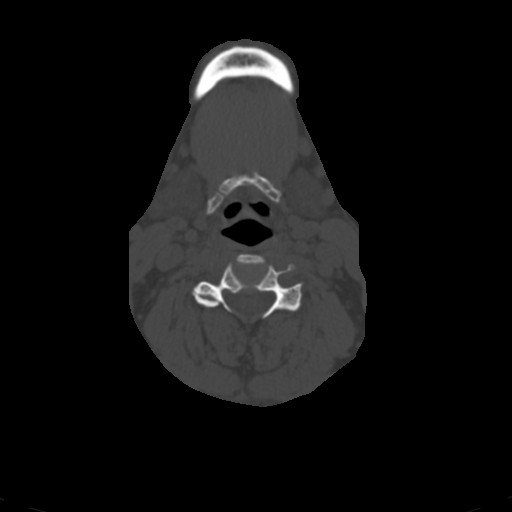
[im 42/244  bone]
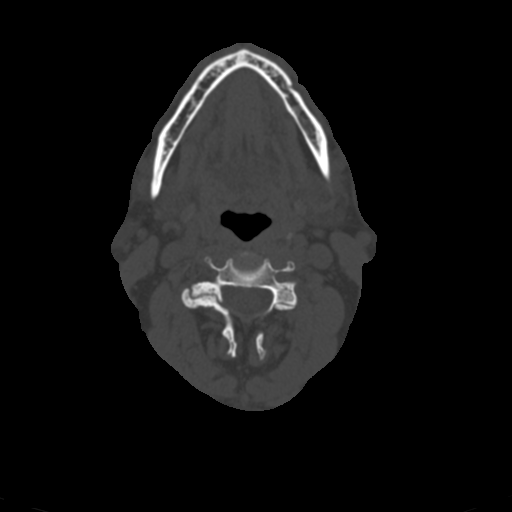
[im 59/244  bone]
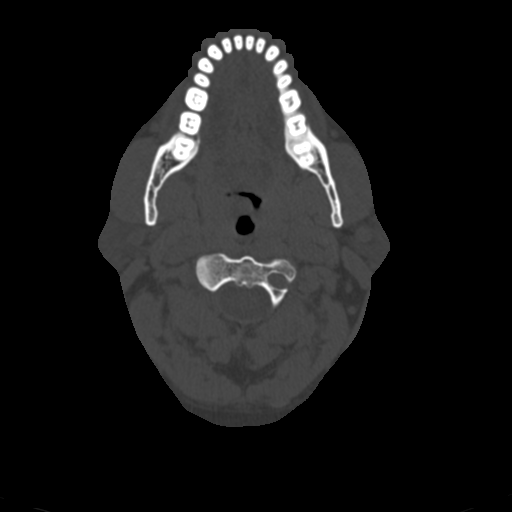
[im 76/244  brain]
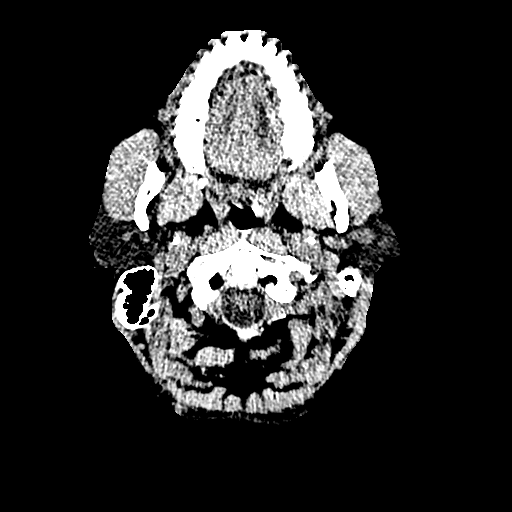
[im 76/244  bone]
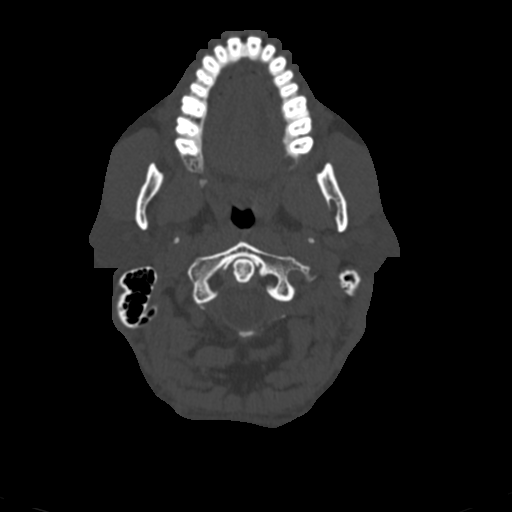
[im 93/244  bone]
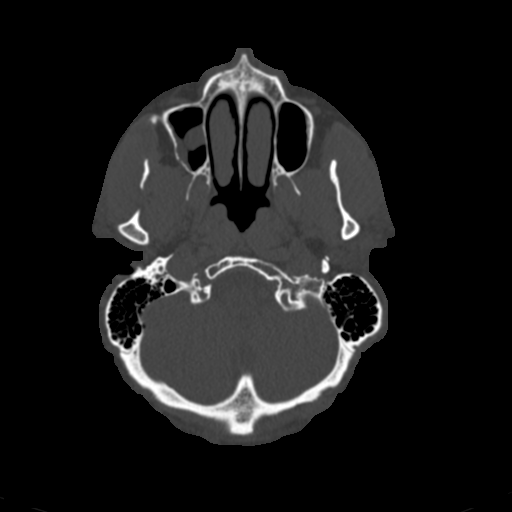
[im 109/244  bone]
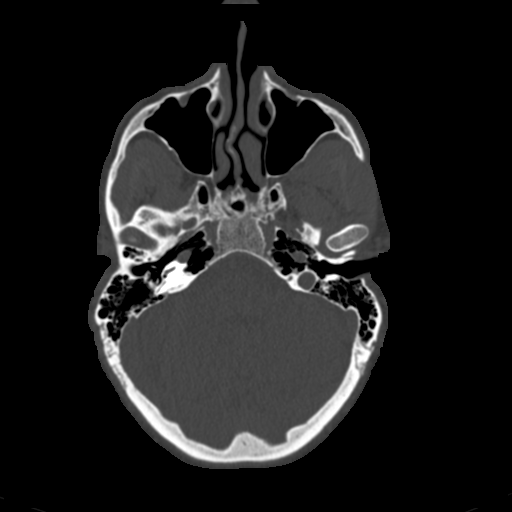
[im 126/244  bone]
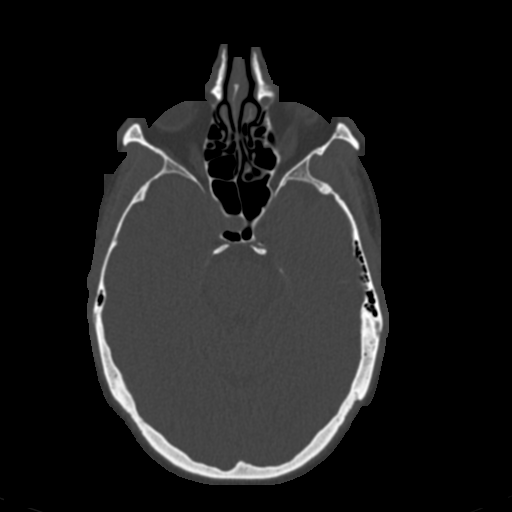
[im 135/244  brain]
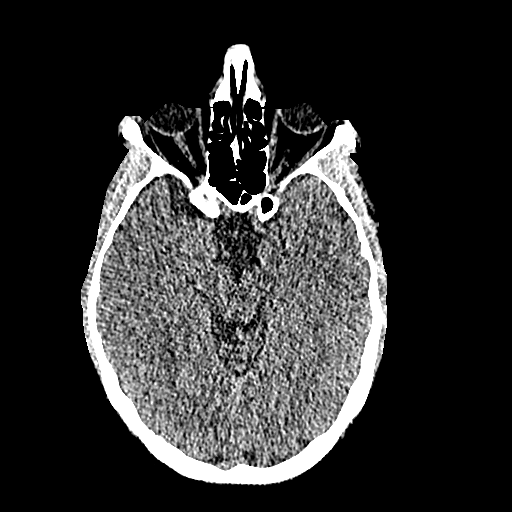
[im 135/244  bone]
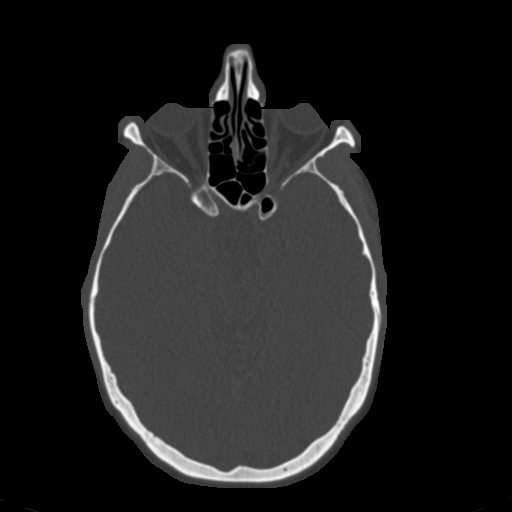
[im 151/244  bone]
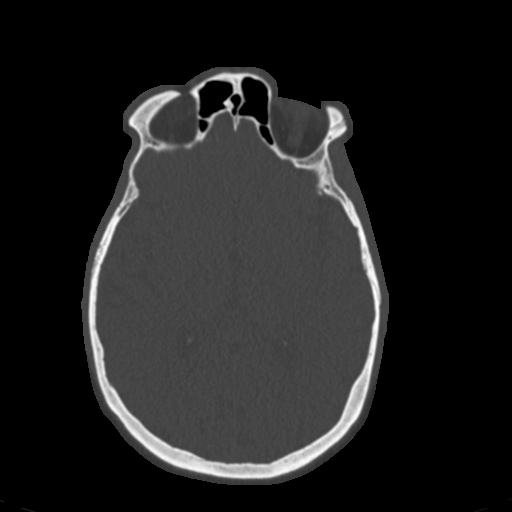
[im 168/244  bone]
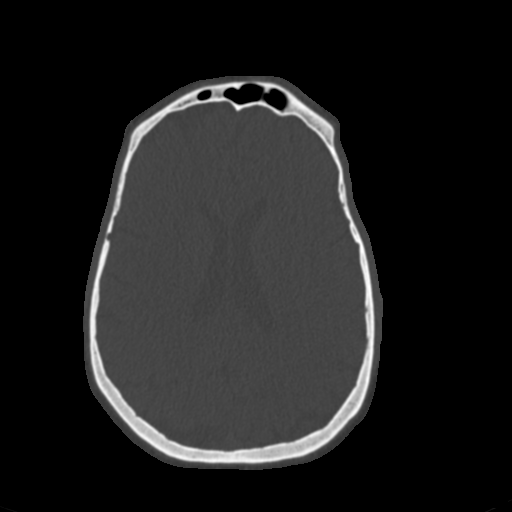
[im 185/244  bone]
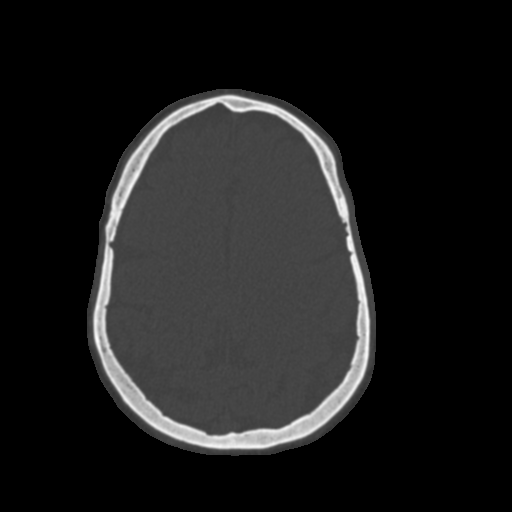
[im 202/244  brain]
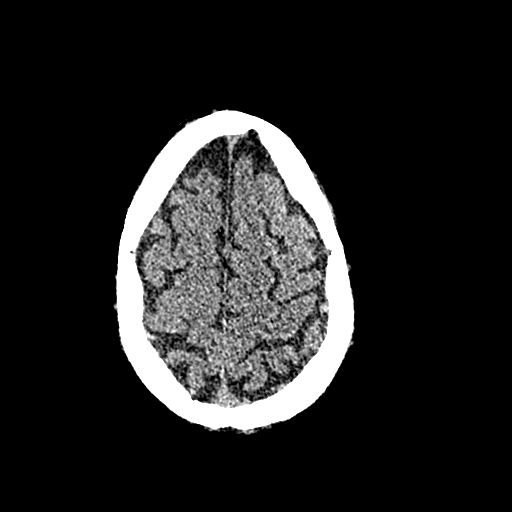
[im 202/244  bone]
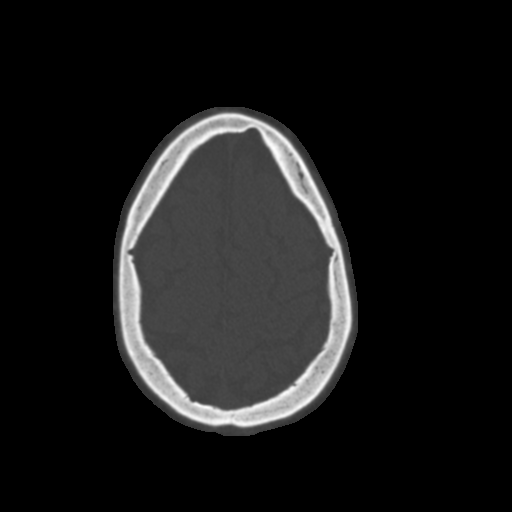
[im 218/244  bone]
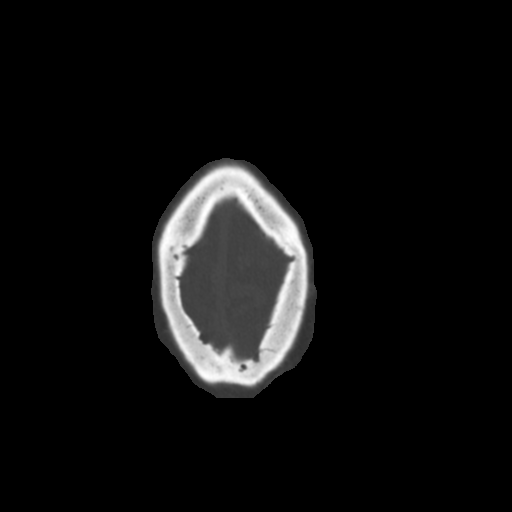
[im 235/244  bone]
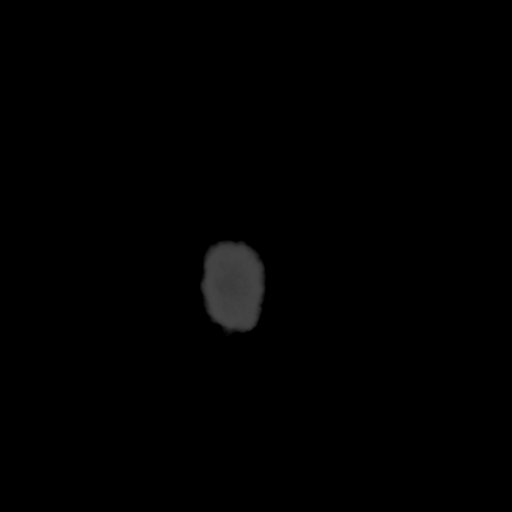

[15 of 30 positions shown; findings below may reference images not displayed]

FINDINGS: Paranasal sinuses:

Frontal: Normally aerated. Patent frontal sinus drainage pathways.

Ethmoid: Normally aerated.

Maxillary: Mucosal thickening along the floor of the maxillary sinus
is worse right than left. This is similar to the prior study. No
fluid levels are present.

Sphenoid: Normally aerated. Patent sphenoethmoidal recesses.

Right ostiomeatal unit: Patent.

Left ostiomeatal unit: Patent.

Nasal passages: Patent. Intact nasal septum is midline.

Anatomy: There is pneumatization just above the ethmoid notch.
Symmetric and intact olfactory grooves and fovea ethmoidalis, Keros
II (4-7mm) Sellar sphenoid pneumatization pattern. No dehiscence of
carotid or optic canals. No onodi cell.

Other: Orbits and intracranial compartment are unremarkable. Visible
mastoid air cells are normally aerated.
IMPRESSION: 1. Stable chronic inferior maxillary sinus disease, right greater
than left.
2. No fluid levels to suggest acute sinusitis.

## 2018-06-17 DIAGNOSIS — S0501XA Injury of conjunctiva and corneal abrasion without foreign body, right eye, initial encounter: Secondary | ICD-10-CM | POA: Diagnosis not present

## 2018-06-18 DIAGNOSIS — S0501XD Injury of conjunctiva and corneal abrasion without foreign body, right eye, subsequent encounter: Secondary | ICD-10-CM | POA: Diagnosis not present

## 2018-06-22 DIAGNOSIS — H16001 Unspecified corneal ulcer, right eye: Secondary | ICD-10-CM | POA: Diagnosis not present

## 2018-06-24 DIAGNOSIS — H16041 Marginal corneal ulcer, right eye: Secondary | ICD-10-CM | POA: Diagnosis not present

## 2018-07-28 DIAGNOSIS — J3089 Other allergic rhinitis: Secondary | ICD-10-CM | POA: Diagnosis not present

## 2018-07-28 DIAGNOSIS — J301 Allergic rhinitis due to pollen: Secondary | ICD-10-CM | POA: Diagnosis not present

## 2018-07-28 DIAGNOSIS — J3081 Allergic rhinitis due to animal (cat) (dog) hair and dander: Secondary | ICD-10-CM | POA: Diagnosis not present

## 2018-07-30 DIAGNOSIS — J3089 Other allergic rhinitis: Secondary | ICD-10-CM | POA: Diagnosis not present

## 2018-07-30 DIAGNOSIS — J3081 Allergic rhinitis due to animal (cat) (dog) hair and dander: Secondary | ICD-10-CM | POA: Diagnosis not present

## 2018-07-30 DIAGNOSIS — J301 Allergic rhinitis due to pollen: Secondary | ICD-10-CM | POA: Diagnosis not present

## 2018-07-30 DIAGNOSIS — R972 Elevated prostate specific antigen [PSA]: Secondary | ICD-10-CM | POA: Diagnosis not present

## 2018-08-05 DIAGNOSIS — J301 Allergic rhinitis due to pollen: Secondary | ICD-10-CM | POA: Diagnosis not present

## 2018-08-05 DIAGNOSIS — J3089 Other allergic rhinitis: Secondary | ICD-10-CM | POA: Diagnosis not present

## 2018-08-05 DIAGNOSIS — J3081 Allergic rhinitis due to animal (cat) (dog) hair and dander: Secondary | ICD-10-CM | POA: Diagnosis not present

## 2018-08-07 DIAGNOSIS — J301 Allergic rhinitis due to pollen: Secondary | ICD-10-CM | POA: Diagnosis not present

## 2018-08-07 DIAGNOSIS — J3081 Allergic rhinitis due to animal (cat) (dog) hair and dander: Secondary | ICD-10-CM | POA: Diagnosis not present

## 2018-08-07 DIAGNOSIS — J3089 Other allergic rhinitis: Secondary | ICD-10-CM | POA: Diagnosis not present

## 2018-08-12 DIAGNOSIS — J301 Allergic rhinitis due to pollen: Secondary | ICD-10-CM | POA: Diagnosis not present

## 2018-08-12 DIAGNOSIS — J3089 Other allergic rhinitis: Secondary | ICD-10-CM | POA: Diagnosis not present

## 2018-08-12 DIAGNOSIS — J3081 Allergic rhinitis due to animal (cat) (dog) hair and dander: Secondary | ICD-10-CM | POA: Diagnosis not present

## 2018-08-14 DIAGNOSIS — J301 Allergic rhinitis due to pollen: Secondary | ICD-10-CM | POA: Diagnosis not present

## 2018-08-14 DIAGNOSIS — J3081 Allergic rhinitis due to animal (cat) (dog) hair and dander: Secondary | ICD-10-CM | POA: Diagnosis not present

## 2018-08-14 DIAGNOSIS — J3089 Other allergic rhinitis: Secondary | ICD-10-CM | POA: Diagnosis not present

## 2018-08-19 DIAGNOSIS — J3081 Allergic rhinitis due to animal (cat) (dog) hair and dander: Secondary | ICD-10-CM | POA: Diagnosis not present

## 2018-08-19 DIAGNOSIS — J3089 Other allergic rhinitis: Secondary | ICD-10-CM | POA: Diagnosis not present

## 2018-08-19 DIAGNOSIS — J301 Allergic rhinitis due to pollen: Secondary | ICD-10-CM | POA: Diagnosis not present

## 2018-08-21 DIAGNOSIS — J301 Allergic rhinitis due to pollen: Secondary | ICD-10-CM | POA: Diagnosis not present

## 2018-08-21 DIAGNOSIS — J3081 Allergic rhinitis due to animal (cat) (dog) hair and dander: Secondary | ICD-10-CM | POA: Diagnosis not present

## 2018-08-21 DIAGNOSIS — J3089 Other allergic rhinitis: Secondary | ICD-10-CM | POA: Diagnosis not present

## 2018-08-31 DIAGNOSIS — J3081 Allergic rhinitis due to animal (cat) (dog) hair and dander: Secondary | ICD-10-CM | POA: Diagnosis not present

## 2018-08-31 DIAGNOSIS — J3089 Other allergic rhinitis: Secondary | ICD-10-CM | POA: Diagnosis not present

## 2018-08-31 DIAGNOSIS — J301 Allergic rhinitis due to pollen: Secondary | ICD-10-CM | POA: Diagnosis not present

## 2018-09-10 DIAGNOSIS — J3081 Allergic rhinitis due to animal (cat) (dog) hair and dander: Secondary | ICD-10-CM | POA: Diagnosis not present

## 2018-09-10 DIAGNOSIS — Z125 Encounter for screening for malignant neoplasm of prostate: Secondary | ICD-10-CM | POA: Diagnosis not present

## 2018-09-10 DIAGNOSIS — J3089 Other allergic rhinitis: Secondary | ICD-10-CM | POA: Diagnosis not present

## 2018-09-10 DIAGNOSIS — Z0000 Encounter for general adult medical examination without abnormal findings: Secondary | ICD-10-CM | POA: Diagnosis not present

## 2018-09-10 DIAGNOSIS — E7849 Other hyperlipidemia: Secondary | ICD-10-CM | POA: Diagnosis not present

## 2018-09-10 DIAGNOSIS — J301 Allergic rhinitis due to pollen: Secondary | ICD-10-CM | POA: Diagnosis not present

## 2018-09-10 DIAGNOSIS — I10 Essential (primary) hypertension: Secondary | ICD-10-CM | POA: Diagnosis not present

## 2018-09-11 DIAGNOSIS — R82998 Other abnormal findings in urine: Secondary | ICD-10-CM | POA: Diagnosis not present

## 2018-09-11 DIAGNOSIS — I10 Essential (primary) hypertension: Secondary | ICD-10-CM | POA: Diagnosis not present

## 2018-09-17 DIAGNOSIS — J3081 Allergic rhinitis due to animal (cat) (dog) hair and dander: Secondary | ICD-10-CM | POA: Diagnosis not present

## 2018-09-17 DIAGNOSIS — Z0000 Encounter for general adult medical examination without abnormal findings: Secondary | ICD-10-CM | POA: Diagnosis not present

## 2018-09-17 DIAGNOSIS — R972 Elevated prostate specific antigen [PSA]: Secondary | ICD-10-CM | POA: Diagnosis not present

## 2018-09-17 DIAGNOSIS — J301 Allergic rhinitis due to pollen: Secondary | ICD-10-CM | POA: Diagnosis not present

## 2018-09-17 DIAGNOSIS — I10 Essential (primary) hypertension: Secondary | ICD-10-CM | POA: Diagnosis not present

## 2018-09-17 DIAGNOSIS — J3089 Other allergic rhinitis: Secondary | ICD-10-CM | POA: Diagnosis not present

## 2018-09-17 DIAGNOSIS — R7301 Impaired fasting glucose: Secondary | ICD-10-CM | POA: Diagnosis not present

## 2018-09-17 DIAGNOSIS — E785 Hyperlipidemia, unspecified: Secondary | ICD-10-CM | POA: Diagnosis not present

## 2018-09-29 DIAGNOSIS — J3089 Other allergic rhinitis: Secondary | ICD-10-CM | POA: Diagnosis not present

## 2018-09-29 DIAGNOSIS — J301 Allergic rhinitis due to pollen: Secondary | ICD-10-CM | POA: Diagnosis not present

## 2018-09-29 DIAGNOSIS — J3081 Allergic rhinitis due to animal (cat) (dog) hair and dander: Secondary | ICD-10-CM | POA: Diagnosis not present

## 2018-10-06 DIAGNOSIS — J301 Allergic rhinitis due to pollen: Secondary | ICD-10-CM | POA: Diagnosis not present

## 2018-10-06 DIAGNOSIS — J3081 Allergic rhinitis due to animal (cat) (dog) hair and dander: Secondary | ICD-10-CM | POA: Diagnosis not present

## 2018-10-06 DIAGNOSIS — J3089 Other allergic rhinitis: Secondary | ICD-10-CM | POA: Diagnosis not present

## 2018-11-13 DIAGNOSIS — J3081 Allergic rhinitis due to animal (cat) (dog) hair and dander: Secondary | ICD-10-CM | POA: Diagnosis not present

## 2018-11-13 DIAGNOSIS — J3089 Other allergic rhinitis: Secondary | ICD-10-CM | POA: Diagnosis not present

## 2018-11-13 DIAGNOSIS — J301 Allergic rhinitis due to pollen: Secondary | ICD-10-CM | POA: Diagnosis not present

## 2018-11-16 ENCOUNTER — Other Ambulatory Visit: Payer: Self-pay

## 2018-11-16 ENCOUNTER — Other Ambulatory Visit (HOSPITAL_COMMUNITY): Payer: BLUE CROSS/BLUE SHIELD

## 2018-11-16 DIAGNOSIS — R6889 Other general symptoms and signs: Secondary | ICD-10-CM | POA: Diagnosis not present

## 2018-11-16 DIAGNOSIS — Z20822 Contact with and (suspected) exposure to covid-19: Secondary | ICD-10-CM

## 2018-11-18 LAB — NOVEL CORONAVIRUS, NAA: SARS-CoV-2, NAA: NOT DETECTED

## 2018-12-17 DIAGNOSIS — J3081 Allergic rhinitis due to animal (cat) (dog) hair and dander: Secondary | ICD-10-CM | POA: Diagnosis not present

## 2018-12-17 DIAGNOSIS — J3089 Other allergic rhinitis: Secondary | ICD-10-CM | POA: Diagnosis not present

## 2018-12-17 DIAGNOSIS — J301 Allergic rhinitis due to pollen: Secondary | ICD-10-CM | POA: Diagnosis not present

## 2018-12-24 DIAGNOSIS — J3081 Allergic rhinitis due to animal (cat) (dog) hair and dander: Secondary | ICD-10-CM | POA: Diagnosis not present

## 2018-12-24 DIAGNOSIS — J301 Allergic rhinitis due to pollen: Secondary | ICD-10-CM | POA: Diagnosis not present

## 2018-12-25 DIAGNOSIS — J3089 Other allergic rhinitis: Secondary | ICD-10-CM | POA: Diagnosis not present

## 2018-12-25 DIAGNOSIS — J3081 Allergic rhinitis due to animal (cat) (dog) hair and dander: Secondary | ICD-10-CM | POA: Diagnosis not present

## 2019-02-05 DIAGNOSIS — J301 Allergic rhinitis due to pollen: Secondary | ICD-10-CM | POA: Diagnosis not present

## 2019-02-05 DIAGNOSIS — J3081 Allergic rhinitis due to animal (cat) (dog) hair and dander: Secondary | ICD-10-CM | POA: Diagnosis not present

## 2019-02-05 DIAGNOSIS — T783XXD Angioneurotic edema, subsequent encounter: Secondary | ICD-10-CM | POA: Diagnosis not present

## 2019-02-05 DIAGNOSIS — J3089 Other allergic rhinitis: Secondary | ICD-10-CM | POA: Diagnosis not present

## 2019-02-11 DIAGNOSIS — J301 Allergic rhinitis due to pollen: Secondary | ICD-10-CM | POA: Diagnosis not present

## 2019-02-11 DIAGNOSIS — J3089 Other allergic rhinitis: Secondary | ICD-10-CM | POA: Diagnosis not present

## 2019-02-11 DIAGNOSIS — J3081 Allergic rhinitis due to animal (cat) (dog) hair and dander: Secondary | ICD-10-CM | POA: Diagnosis not present

## 2019-02-18 DIAGNOSIS — J301 Allergic rhinitis due to pollen: Secondary | ICD-10-CM | POA: Diagnosis not present

## 2019-02-18 DIAGNOSIS — J3089 Other allergic rhinitis: Secondary | ICD-10-CM | POA: Diagnosis not present

## 2019-02-18 DIAGNOSIS — J3081 Allergic rhinitis due to animal (cat) (dog) hair and dander: Secondary | ICD-10-CM | POA: Diagnosis not present

## 2019-02-23 DIAGNOSIS — J3081 Allergic rhinitis due to animal (cat) (dog) hair and dander: Secondary | ICD-10-CM | POA: Diagnosis not present

## 2019-02-23 DIAGNOSIS — J301 Allergic rhinitis due to pollen: Secondary | ICD-10-CM | POA: Diagnosis not present

## 2019-02-23 DIAGNOSIS — J3089 Other allergic rhinitis: Secondary | ICD-10-CM | POA: Diagnosis not present

## 2019-02-25 DIAGNOSIS — J301 Allergic rhinitis due to pollen: Secondary | ICD-10-CM | POA: Diagnosis not present

## 2019-02-25 DIAGNOSIS — J3081 Allergic rhinitis due to animal (cat) (dog) hair and dander: Secondary | ICD-10-CM | POA: Diagnosis not present

## 2019-02-25 DIAGNOSIS — J3089 Other allergic rhinitis: Secondary | ICD-10-CM | POA: Diagnosis not present

## 2019-03-11 DIAGNOSIS — J3081 Allergic rhinitis due to animal (cat) (dog) hair and dander: Secondary | ICD-10-CM | POA: Diagnosis not present

## 2019-03-11 DIAGNOSIS — J3089 Other allergic rhinitis: Secondary | ICD-10-CM | POA: Diagnosis not present

## 2019-03-11 DIAGNOSIS — J301 Allergic rhinitis due to pollen: Secondary | ICD-10-CM | POA: Diagnosis not present

## 2019-03-18 DIAGNOSIS — J3089 Other allergic rhinitis: Secondary | ICD-10-CM | POA: Diagnosis not present

## 2019-03-18 DIAGNOSIS — J3081 Allergic rhinitis due to animal (cat) (dog) hair and dander: Secondary | ICD-10-CM | POA: Diagnosis not present

## 2019-03-18 DIAGNOSIS — J301 Allergic rhinitis due to pollen: Secondary | ICD-10-CM | POA: Diagnosis not present

## 2019-03-22 DIAGNOSIS — H40013 Open angle with borderline findings, low risk, bilateral: Secondary | ICD-10-CM | POA: Diagnosis not present

## 2019-04-21 DIAGNOSIS — Z20822 Contact with and (suspected) exposure to covid-19: Secondary | ICD-10-CM | POA: Diagnosis not present

## 2019-06-17 DIAGNOSIS — H40013 Open angle with borderline findings, low risk, bilateral: Secondary | ICD-10-CM | POA: Diagnosis not present

## 2019-08-09 DIAGNOSIS — J3089 Other allergic rhinitis: Secondary | ICD-10-CM | POA: Diagnosis not present

## 2019-08-09 DIAGNOSIS — T783XXD Angioneurotic edema, subsequent encounter: Secondary | ICD-10-CM | POA: Diagnosis not present

## 2019-08-09 DIAGNOSIS — J301 Allergic rhinitis due to pollen: Secondary | ICD-10-CM | POA: Diagnosis not present

## 2019-08-09 DIAGNOSIS — J3081 Allergic rhinitis due to animal (cat) (dog) hair and dander: Secondary | ICD-10-CM | POA: Diagnosis not present

## 2019-08-13 DIAGNOSIS — J3089 Other allergic rhinitis: Secondary | ICD-10-CM | POA: Diagnosis not present

## 2019-08-13 DIAGNOSIS — J301 Allergic rhinitis due to pollen: Secondary | ICD-10-CM | POA: Diagnosis not present

## 2019-08-13 DIAGNOSIS — J3081 Allergic rhinitis due to animal (cat) (dog) hair and dander: Secondary | ICD-10-CM | POA: Diagnosis not present

## 2019-08-18 DIAGNOSIS — J3081 Allergic rhinitis due to animal (cat) (dog) hair and dander: Secondary | ICD-10-CM | POA: Diagnosis not present

## 2019-08-18 DIAGNOSIS — J3089 Other allergic rhinitis: Secondary | ICD-10-CM | POA: Diagnosis not present

## 2019-08-18 DIAGNOSIS — J301 Allergic rhinitis due to pollen: Secondary | ICD-10-CM | POA: Diagnosis not present

## 2019-08-20 DIAGNOSIS — J301 Allergic rhinitis due to pollen: Secondary | ICD-10-CM | POA: Diagnosis not present

## 2019-08-20 DIAGNOSIS — J3081 Allergic rhinitis due to animal (cat) (dog) hair and dander: Secondary | ICD-10-CM | POA: Diagnosis not present

## 2019-08-20 DIAGNOSIS — J3089 Other allergic rhinitis: Secondary | ICD-10-CM | POA: Diagnosis not present

## 2019-08-25 DIAGNOSIS — M9901 Segmental and somatic dysfunction of cervical region: Secondary | ICD-10-CM | POA: Diagnosis not present

## 2019-08-25 DIAGNOSIS — M546 Pain in thoracic spine: Secondary | ICD-10-CM | POA: Diagnosis not present

## 2019-08-25 DIAGNOSIS — J3089 Other allergic rhinitis: Secondary | ICD-10-CM | POA: Diagnosis not present

## 2019-08-25 DIAGNOSIS — J3081 Allergic rhinitis due to animal (cat) (dog) hair and dander: Secondary | ICD-10-CM | POA: Diagnosis not present

## 2019-08-25 DIAGNOSIS — J301 Allergic rhinitis due to pollen: Secondary | ICD-10-CM | POA: Diagnosis not present

## 2019-08-25 DIAGNOSIS — M9902 Segmental and somatic dysfunction of thoracic region: Secondary | ICD-10-CM | POA: Diagnosis not present

## 2019-08-25 DIAGNOSIS — M542 Cervicalgia: Secondary | ICD-10-CM | POA: Diagnosis not present

## 2019-08-27 DIAGNOSIS — J3089 Other allergic rhinitis: Secondary | ICD-10-CM | POA: Diagnosis not present

## 2019-08-27 DIAGNOSIS — J3081 Allergic rhinitis due to animal (cat) (dog) hair and dander: Secondary | ICD-10-CM | POA: Diagnosis not present

## 2019-08-27 DIAGNOSIS — J301 Allergic rhinitis due to pollen: Secondary | ICD-10-CM | POA: Diagnosis not present

## 2019-09-01 DIAGNOSIS — M542 Cervicalgia: Secondary | ICD-10-CM | POA: Diagnosis not present

## 2019-09-01 DIAGNOSIS — M546 Pain in thoracic spine: Secondary | ICD-10-CM | POA: Diagnosis not present

## 2019-09-01 DIAGNOSIS — M9901 Segmental and somatic dysfunction of cervical region: Secondary | ICD-10-CM | POA: Diagnosis not present

## 2019-09-01 DIAGNOSIS — M9902 Segmental and somatic dysfunction of thoracic region: Secondary | ICD-10-CM | POA: Diagnosis not present

## 2019-09-03 DIAGNOSIS — J3081 Allergic rhinitis due to animal (cat) (dog) hair and dander: Secondary | ICD-10-CM | POA: Diagnosis not present

## 2019-09-03 DIAGNOSIS — M9901 Segmental and somatic dysfunction of cervical region: Secondary | ICD-10-CM | POA: Diagnosis not present

## 2019-09-03 DIAGNOSIS — M9902 Segmental and somatic dysfunction of thoracic region: Secondary | ICD-10-CM | POA: Diagnosis not present

## 2019-09-03 DIAGNOSIS — J3089 Other allergic rhinitis: Secondary | ICD-10-CM | POA: Diagnosis not present

## 2019-09-03 DIAGNOSIS — M546 Pain in thoracic spine: Secondary | ICD-10-CM | POA: Diagnosis not present

## 2019-09-03 DIAGNOSIS — J301 Allergic rhinitis due to pollen: Secondary | ICD-10-CM | POA: Diagnosis not present

## 2019-09-03 DIAGNOSIS — M542 Cervicalgia: Secondary | ICD-10-CM | POA: Diagnosis not present

## 2019-09-06 DIAGNOSIS — J3089 Other allergic rhinitis: Secondary | ICD-10-CM | POA: Diagnosis not present

## 2019-09-06 DIAGNOSIS — J3081 Allergic rhinitis due to animal (cat) (dog) hair and dander: Secondary | ICD-10-CM | POA: Diagnosis not present

## 2019-09-06 DIAGNOSIS — M9901 Segmental and somatic dysfunction of cervical region: Secondary | ICD-10-CM | POA: Diagnosis not present

## 2019-09-06 DIAGNOSIS — J301 Allergic rhinitis due to pollen: Secondary | ICD-10-CM | POA: Diagnosis not present

## 2019-09-06 DIAGNOSIS — M542 Cervicalgia: Secondary | ICD-10-CM | POA: Diagnosis not present

## 2019-09-06 DIAGNOSIS — M9902 Segmental and somatic dysfunction of thoracic region: Secondary | ICD-10-CM | POA: Diagnosis not present

## 2019-09-06 DIAGNOSIS — M546 Pain in thoracic spine: Secondary | ICD-10-CM | POA: Diagnosis not present

## 2019-09-08 DIAGNOSIS — M9901 Segmental and somatic dysfunction of cervical region: Secondary | ICD-10-CM | POA: Diagnosis not present

## 2019-09-08 DIAGNOSIS — M542 Cervicalgia: Secondary | ICD-10-CM | POA: Diagnosis not present

## 2019-09-08 DIAGNOSIS — M546 Pain in thoracic spine: Secondary | ICD-10-CM | POA: Diagnosis not present

## 2019-09-08 DIAGNOSIS — M9902 Segmental and somatic dysfunction of thoracic region: Secondary | ICD-10-CM | POA: Diagnosis not present

## 2019-09-10 DIAGNOSIS — M9902 Segmental and somatic dysfunction of thoracic region: Secondary | ICD-10-CM | POA: Diagnosis not present

## 2019-09-10 DIAGNOSIS — M542 Cervicalgia: Secondary | ICD-10-CM | POA: Diagnosis not present

## 2019-09-10 DIAGNOSIS — M9901 Segmental and somatic dysfunction of cervical region: Secondary | ICD-10-CM | POA: Diagnosis not present

## 2019-09-10 DIAGNOSIS — J3081 Allergic rhinitis due to animal (cat) (dog) hair and dander: Secondary | ICD-10-CM | POA: Diagnosis not present

## 2019-09-10 DIAGNOSIS — J3089 Other allergic rhinitis: Secondary | ICD-10-CM | POA: Diagnosis not present

## 2019-09-10 DIAGNOSIS — J301 Allergic rhinitis due to pollen: Secondary | ICD-10-CM | POA: Diagnosis not present

## 2019-09-10 DIAGNOSIS — M546 Pain in thoracic spine: Secondary | ICD-10-CM | POA: Diagnosis not present

## 2019-09-13 DIAGNOSIS — M9901 Segmental and somatic dysfunction of cervical region: Secondary | ICD-10-CM | POA: Diagnosis not present

## 2019-09-13 DIAGNOSIS — M9902 Segmental and somatic dysfunction of thoracic region: Secondary | ICD-10-CM | POA: Diagnosis not present

## 2019-09-13 DIAGNOSIS — M542 Cervicalgia: Secondary | ICD-10-CM | POA: Diagnosis not present

## 2019-09-13 DIAGNOSIS — M546 Pain in thoracic spine: Secondary | ICD-10-CM | POA: Diagnosis not present

## 2019-09-15 DIAGNOSIS — M9902 Segmental and somatic dysfunction of thoracic region: Secondary | ICD-10-CM | POA: Diagnosis not present

## 2019-09-15 DIAGNOSIS — J301 Allergic rhinitis due to pollen: Secondary | ICD-10-CM | POA: Diagnosis not present

## 2019-09-15 DIAGNOSIS — J3089 Other allergic rhinitis: Secondary | ICD-10-CM | POA: Diagnosis not present

## 2019-09-15 DIAGNOSIS — J3081 Allergic rhinitis due to animal (cat) (dog) hair and dander: Secondary | ICD-10-CM | POA: Diagnosis not present

## 2019-09-15 DIAGNOSIS — M542 Cervicalgia: Secondary | ICD-10-CM | POA: Diagnosis not present

## 2019-09-15 DIAGNOSIS — S0502XA Injury of conjunctiva and corneal abrasion without foreign body, left eye, initial encounter: Secondary | ICD-10-CM | POA: Diagnosis not present

## 2019-09-15 DIAGNOSIS — M9901 Segmental and somatic dysfunction of cervical region: Secondary | ICD-10-CM | POA: Diagnosis not present

## 2019-09-15 DIAGNOSIS — M546 Pain in thoracic spine: Secondary | ICD-10-CM | POA: Diagnosis not present

## 2019-09-17 DIAGNOSIS — J3089 Other allergic rhinitis: Secondary | ICD-10-CM | POA: Diagnosis not present

## 2019-09-17 DIAGNOSIS — J3081 Allergic rhinitis due to animal (cat) (dog) hair and dander: Secondary | ICD-10-CM | POA: Diagnosis not present

## 2019-09-17 DIAGNOSIS — J301 Allergic rhinitis due to pollen: Secondary | ICD-10-CM | POA: Diagnosis not present

## 2019-09-20 DIAGNOSIS — M9901 Segmental and somatic dysfunction of cervical region: Secondary | ICD-10-CM | POA: Diagnosis not present

## 2019-09-20 DIAGNOSIS — M9902 Segmental and somatic dysfunction of thoracic region: Secondary | ICD-10-CM | POA: Diagnosis not present

## 2019-09-20 DIAGNOSIS — M542 Cervicalgia: Secondary | ICD-10-CM | POA: Diagnosis not present

## 2019-09-20 DIAGNOSIS — M546 Pain in thoracic spine: Secondary | ICD-10-CM | POA: Diagnosis not present

## 2019-09-20 DIAGNOSIS — S0502XD Injury of conjunctiva and corneal abrasion without foreign body, left eye, subsequent encounter: Secondary | ICD-10-CM | POA: Diagnosis not present

## 2019-09-22 DIAGNOSIS — M542 Cervicalgia: Secondary | ICD-10-CM | POA: Diagnosis not present

## 2019-09-22 DIAGNOSIS — M9902 Segmental and somatic dysfunction of thoracic region: Secondary | ICD-10-CM | POA: Diagnosis not present

## 2019-09-22 DIAGNOSIS — M9901 Segmental and somatic dysfunction of cervical region: Secondary | ICD-10-CM | POA: Diagnosis not present

## 2019-09-22 DIAGNOSIS — M546 Pain in thoracic spine: Secondary | ICD-10-CM | POA: Diagnosis not present

## 2019-09-22 DIAGNOSIS — J3081 Allergic rhinitis due to animal (cat) (dog) hair and dander: Secondary | ICD-10-CM | POA: Diagnosis not present

## 2019-09-22 DIAGNOSIS — J301 Allergic rhinitis due to pollen: Secondary | ICD-10-CM | POA: Diagnosis not present

## 2019-09-22 DIAGNOSIS — J3089 Other allergic rhinitis: Secondary | ICD-10-CM | POA: Diagnosis not present

## 2019-09-24 DIAGNOSIS — J301 Allergic rhinitis due to pollen: Secondary | ICD-10-CM | POA: Diagnosis not present

## 2019-09-24 DIAGNOSIS — J3089 Other allergic rhinitis: Secondary | ICD-10-CM | POA: Diagnosis not present

## 2019-09-24 DIAGNOSIS — J3081 Allergic rhinitis due to animal (cat) (dog) hair and dander: Secondary | ICD-10-CM | POA: Diagnosis not present

## 2019-09-29 DIAGNOSIS — J301 Allergic rhinitis due to pollen: Secondary | ICD-10-CM | POA: Diagnosis not present

## 2019-09-29 DIAGNOSIS — J3089 Other allergic rhinitis: Secondary | ICD-10-CM | POA: Diagnosis not present

## 2019-09-29 DIAGNOSIS — J3081 Allergic rhinitis due to animal (cat) (dog) hair and dander: Secondary | ICD-10-CM | POA: Diagnosis not present

## 2019-10-04 DIAGNOSIS — M542 Cervicalgia: Secondary | ICD-10-CM | POA: Diagnosis not present

## 2019-10-04 DIAGNOSIS — M546 Pain in thoracic spine: Secondary | ICD-10-CM | POA: Diagnosis not present

## 2019-10-04 DIAGNOSIS — J301 Allergic rhinitis due to pollen: Secondary | ICD-10-CM | POA: Diagnosis not present

## 2019-10-04 DIAGNOSIS — S0502XS Injury of conjunctiva and corneal abrasion without foreign body, left eye, sequela: Secondary | ICD-10-CM | POA: Diagnosis not present

## 2019-10-04 DIAGNOSIS — M9902 Segmental and somatic dysfunction of thoracic region: Secondary | ICD-10-CM | POA: Diagnosis not present

## 2019-10-04 DIAGNOSIS — M9901 Segmental and somatic dysfunction of cervical region: Secondary | ICD-10-CM | POA: Diagnosis not present

## 2019-10-04 DIAGNOSIS — J3089 Other allergic rhinitis: Secondary | ICD-10-CM | POA: Diagnosis not present

## 2019-10-04 DIAGNOSIS — J3081 Allergic rhinitis due to animal (cat) (dog) hair and dander: Secondary | ICD-10-CM | POA: Diagnosis not present

## 2019-10-08 DIAGNOSIS — M9902 Segmental and somatic dysfunction of thoracic region: Secondary | ICD-10-CM | POA: Diagnosis not present

## 2019-10-08 DIAGNOSIS — M9901 Segmental and somatic dysfunction of cervical region: Secondary | ICD-10-CM | POA: Diagnosis not present

## 2019-10-08 DIAGNOSIS — J3081 Allergic rhinitis due to animal (cat) (dog) hair and dander: Secondary | ICD-10-CM | POA: Diagnosis not present

## 2019-10-08 DIAGNOSIS — J3089 Other allergic rhinitis: Secondary | ICD-10-CM | POA: Diagnosis not present

## 2019-10-08 DIAGNOSIS — M546 Pain in thoracic spine: Secondary | ICD-10-CM | POA: Diagnosis not present

## 2019-10-08 DIAGNOSIS — M542 Cervicalgia: Secondary | ICD-10-CM | POA: Diagnosis not present

## 2019-10-08 DIAGNOSIS — J301 Allergic rhinitis due to pollen: Secondary | ICD-10-CM | POA: Diagnosis not present

## 2019-10-11 DIAGNOSIS — J3081 Allergic rhinitis due to animal (cat) (dog) hair and dander: Secondary | ICD-10-CM | POA: Diagnosis not present

## 2019-10-11 DIAGNOSIS — M546 Pain in thoracic spine: Secondary | ICD-10-CM | POA: Diagnosis not present

## 2019-10-11 DIAGNOSIS — M9901 Segmental and somatic dysfunction of cervical region: Secondary | ICD-10-CM | POA: Diagnosis not present

## 2019-10-11 DIAGNOSIS — J3089 Other allergic rhinitis: Secondary | ICD-10-CM | POA: Diagnosis not present

## 2019-10-11 DIAGNOSIS — M9902 Segmental and somatic dysfunction of thoracic region: Secondary | ICD-10-CM | POA: Diagnosis not present

## 2019-10-11 DIAGNOSIS — M542 Cervicalgia: Secondary | ICD-10-CM | POA: Diagnosis not present

## 2019-10-11 DIAGNOSIS — J301 Allergic rhinitis due to pollen: Secondary | ICD-10-CM | POA: Diagnosis not present

## 2019-10-13 DIAGNOSIS — J3081 Allergic rhinitis due to animal (cat) (dog) hair and dander: Secondary | ICD-10-CM | POA: Diagnosis not present

## 2019-10-13 DIAGNOSIS — J301 Allergic rhinitis due to pollen: Secondary | ICD-10-CM | POA: Diagnosis not present

## 2019-10-13 DIAGNOSIS — J3089 Other allergic rhinitis: Secondary | ICD-10-CM | POA: Diagnosis not present

## 2019-10-15 DIAGNOSIS — M9901 Segmental and somatic dysfunction of cervical region: Secondary | ICD-10-CM | POA: Diagnosis not present

## 2019-10-15 DIAGNOSIS — M9902 Segmental and somatic dysfunction of thoracic region: Secondary | ICD-10-CM | POA: Diagnosis not present

## 2019-10-15 DIAGNOSIS — M542 Cervicalgia: Secondary | ICD-10-CM | POA: Diagnosis not present

## 2019-10-15 DIAGNOSIS — M546 Pain in thoracic spine: Secondary | ICD-10-CM | POA: Diagnosis not present

## 2019-10-18 DIAGNOSIS — M542 Cervicalgia: Secondary | ICD-10-CM | POA: Diagnosis not present

## 2019-10-18 DIAGNOSIS — M546 Pain in thoracic spine: Secondary | ICD-10-CM | POA: Diagnosis not present

## 2019-10-18 DIAGNOSIS — M9901 Segmental and somatic dysfunction of cervical region: Secondary | ICD-10-CM | POA: Diagnosis not present

## 2019-10-18 DIAGNOSIS — M9902 Segmental and somatic dysfunction of thoracic region: Secondary | ICD-10-CM | POA: Diagnosis not present

## 2019-10-19 DIAGNOSIS — J301 Allergic rhinitis due to pollen: Secondary | ICD-10-CM | POA: Diagnosis not present

## 2019-10-19 DIAGNOSIS — J3089 Other allergic rhinitis: Secondary | ICD-10-CM | POA: Diagnosis not present

## 2019-10-19 DIAGNOSIS — J3081 Allergic rhinitis due to animal (cat) (dog) hair and dander: Secondary | ICD-10-CM | POA: Diagnosis not present

## 2019-10-21 DIAGNOSIS — M546 Pain in thoracic spine: Secondary | ICD-10-CM | POA: Diagnosis not present

## 2019-10-21 DIAGNOSIS — J3089 Other allergic rhinitis: Secondary | ICD-10-CM | POA: Diagnosis not present

## 2019-10-21 DIAGNOSIS — J301 Allergic rhinitis due to pollen: Secondary | ICD-10-CM | POA: Diagnosis not present

## 2019-10-21 DIAGNOSIS — M9902 Segmental and somatic dysfunction of thoracic region: Secondary | ICD-10-CM | POA: Diagnosis not present

## 2019-10-21 DIAGNOSIS — J3081 Allergic rhinitis due to animal (cat) (dog) hair and dander: Secondary | ICD-10-CM | POA: Diagnosis not present

## 2019-10-21 DIAGNOSIS — M9901 Segmental and somatic dysfunction of cervical region: Secondary | ICD-10-CM | POA: Diagnosis not present

## 2019-10-21 DIAGNOSIS — M542 Cervicalgia: Secondary | ICD-10-CM | POA: Diagnosis not present

## 2019-10-29 DIAGNOSIS — M9902 Segmental and somatic dysfunction of thoracic region: Secondary | ICD-10-CM | POA: Diagnosis not present

## 2019-10-29 DIAGNOSIS — M542 Cervicalgia: Secondary | ICD-10-CM | POA: Diagnosis not present

## 2019-10-29 DIAGNOSIS — M546 Pain in thoracic spine: Secondary | ICD-10-CM | POA: Diagnosis not present

## 2019-10-29 DIAGNOSIS — J3089 Other allergic rhinitis: Secondary | ICD-10-CM | POA: Diagnosis not present

## 2019-10-29 DIAGNOSIS — M9901 Segmental and somatic dysfunction of cervical region: Secondary | ICD-10-CM | POA: Diagnosis not present

## 2019-10-29 DIAGNOSIS — J3081 Allergic rhinitis due to animal (cat) (dog) hair and dander: Secondary | ICD-10-CM | POA: Diagnosis not present

## 2019-10-29 DIAGNOSIS — J301 Allergic rhinitis due to pollen: Secondary | ICD-10-CM | POA: Diagnosis not present

## 2019-11-04 DIAGNOSIS — M542 Cervicalgia: Secondary | ICD-10-CM | POA: Diagnosis not present

## 2019-11-04 DIAGNOSIS — M9902 Segmental and somatic dysfunction of thoracic region: Secondary | ICD-10-CM | POA: Diagnosis not present

## 2019-11-04 DIAGNOSIS — M9901 Segmental and somatic dysfunction of cervical region: Secondary | ICD-10-CM | POA: Diagnosis not present

## 2019-11-04 DIAGNOSIS — M546 Pain in thoracic spine: Secondary | ICD-10-CM | POA: Diagnosis not present

## 2019-11-05 DIAGNOSIS — J301 Allergic rhinitis due to pollen: Secondary | ICD-10-CM | POA: Diagnosis not present

## 2019-11-05 DIAGNOSIS — J3089 Other allergic rhinitis: Secondary | ICD-10-CM | POA: Diagnosis not present

## 2019-11-05 DIAGNOSIS — J3081 Allergic rhinitis due to animal (cat) (dog) hair and dander: Secondary | ICD-10-CM | POA: Diagnosis not present

## 2019-11-08 DIAGNOSIS — M9902 Segmental and somatic dysfunction of thoracic region: Secondary | ICD-10-CM | POA: Diagnosis not present

## 2019-11-08 DIAGNOSIS — J301 Allergic rhinitis due to pollen: Secondary | ICD-10-CM | POA: Diagnosis not present

## 2019-11-08 DIAGNOSIS — M9901 Segmental and somatic dysfunction of cervical region: Secondary | ICD-10-CM | POA: Diagnosis not present

## 2019-11-08 DIAGNOSIS — M546 Pain in thoracic spine: Secondary | ICD-10-CM | POA: Diagnosis not present

## 2019-11-08 DIAGNOSIS — J3081 Allergic rhinitis due to animal (cat) (dog) hair and dander: Secondary | ICD-10-CM | POA: Diagnosis not present

## 2019-11-08 DIAGNOSIS — J3089 Other allergic rhinitis: Secondary | ICD-10-CM | POA: Diagnosis not present

## 2019-11-08 DIAGNOSIS — M542 Cervicalgia: Secondary | ICD-10-CM | POA: Diagnosis not present

## 2019-11-10 DIAGNOSIS — M546 Pain in thoracic spine: Secondary | ICD-10-CM | POA: Diagnosis not present

## 2019-11-10 DIAGNOSIS — M9902 Segmental and somatic dysfunction of thoracic region: Secondary | ICD-10-CM | POA: Diagnosis not present

## 2019-11-10 DIAGNOSIS — M542 Cervicalgia: Secondary | ICD-10-CM | POA: Diagnosis not present

## 2019-11-10 DIAGNOSIS — M9901 Segmental and somatic dysfunction of cervical region: Secondary | ICD-10-CM | POA: Diagnosis not present

## 2019-11-18 DIAGNOSIS — M542 Cervicalgia: Secondary | ICD-10-CM | POA: Diagnosis not present

## 2019-11-18 DIAGNOSIS — M9902 Segmental and somatic dysfunction of thoracic region: Secondary | ICD-10-CM | POA: Diagnosis not present

## 2019-11-18 DIAGNOSIS — M546 Pain in thoracic spine: Secondary | ICD-10-CM | POA: Diagnosis not present

## 2019-11-18 DIAGNOSIS — M9901 Segmental and somatic dysfunction of cervical region: Secondary | ICD-10-CM | POA: Diagnosis not present

## 2019-11-19 DIAGNOSIS — J301 Allergic rhinitis due to pollen: Secondary | ICD-10-CM | POA: Diagnosis not present

## 2019-11-19 DIAGNOSIS — J3089 Other allergic rhinitis: Secondary | ICD-10-CM | POA: Diagnosis not present

## 2019-11-19 DIAGNOSIS — J3081 Allergic rhinitis due to animal (cat) (dog) hair and dander: Secondary | ICD-10-CM | POA: Diagnosis not present

## 2019-11-23 DIAGNOSIS — M542 Cervicalgia: Secondary | ICD-10-CM | POA: Diagnosis not present

## 2019-11-23 DIAGNOSIS — M546 Pain in thoracic spine: Secondary | ICD-10-CM | POA: Diagnosis not present

## 2019-11-23 DIAGNOSIS — M9901 Segmental and somatic dysfunction of cervical region: Secondary | ICD-10-CM | POA: Diagnosis not present

## 2019-11-23 DIAGNOSIS — M9902 Segmental and somatic dysfunction of thoracic region: Secondary | ICD-10-CM | POA: Diagnosis not present

## 2019-11-25 DIAGNOSIS — M542 Cervicalgia: Secondary | ICD-10-CM | POA: Diagnosis not present

## 2019-11-25 DIAGNOSIS — M9902 Segmental and somatic dysfunction of thoracic region: Secondary | ICD-10-CM | POA: Diagnosis not present

## 2019-11-25 DIAGNOSIS — M9901 Segmental and somatic dysfunction of cervical region: Secondary | ICD-10-CM | POA: Diagnosis not present

## 2019-11-25 DIAGNOSIS — M546 Pain in thoracic spine: Secondary | ICD-10-CM | POA: Diagnosis not present

## 2019-11-29 DIAGNOSIS — M4802 Spinal stenosis, cervical region: Secondary | ICD-10-CM | POA: Diagnosis not present

## 2019-11-29 DIAGNOSIS — M47812 Spondylosis without myelopathy or radiculopathy, cervical region: Secondary | ICD-10-CM | POA: Diagnosis not present

## 2019-11-30 DIAGNOSIS — M9902 Segmental and somatic dysfunction of thoracic region: Secondary | ICD-10-CM | POA: Diagnosis not present

## 2019-11-30 DIAGNOSIS — J3081 Allergic rhinitis due to animal (cat) (dog) hair and dander: Secondary | ICD-10-CM | POA: Diagnosis not present

## 2019-11-30 DIAGNOSIS — M546 Pain in thoracic spine: Secondary | ICD-10-CM | POA: Diagnosis not present

## 2019-11-30 DIAGNOSIS — M9901 Segmental and somatic dysfunction of cervical region: Secondary | ICD-10-CM | POA: Diagnosis not present

## 2019-11-30 DIAGNOSIS — J3089 Other allergic rhinitis: Secondary | ICD-10-CM | POA: Diagnosis not present

## 2019-11-30 DIAGNOSIS — M542 Cervicalgia: Secondary | ICD-10-CM | POA: Diagnosis not present

## 2019-11-30 DIAGNOSIS — J301 Allergic rhinitis due to pollen: Secondary | ICD-10-CM | POA: Diagnosis not present

## 2019-12-07 DIAGNOSIS — M546 Pain in thoracic spine: Secondary | ICD-10-CM | POA: Diagnosis not present

## 2019-12-07 DIAGNOSIS — M542 Cervicalgia: Secondary | ICD-10-CM | POA: Diagnosis not present

## 2019-12-07 DIAGNOSIS — M9902 Segmental and somatic dysfunction of thoracic region: Secondary | ICD-10-CM | POA: Diagnosis not present

## 2019-12-07 DIAGNOSIS — M9901 Segmental and somatic dysfunction of cervical region: Secondary | ICD-10-CM | POA: Diagnosis not present

## 2019-12-09 DIAGNOSIS — J301 Allergic rhinitis due to pollen: Secondary | ICD-10-CM | POA: Diagnosis not present

## 2019-12-09 DIAGNOSIS — J3089 Other allergic rhinitis: Secondary | ICD-10-CM | POA: Diagnosis not present

## 2019-12-09 DIAGNOSIS — J3081 Allergic rhinitis due to animal (cat) (dog) hair and dander: Secondary | ICD-10-CM | POA: Diagnosis not present

## 2019-12-14 DIAGNOSIS — M542 Cervicalgia: Secondary | ICD-10-CM | POA: Diagnosis not present

## 2019-12-14 DIAGNOSIS — M9902 Segmental and somatic dysfunction of thoracic region: Secondary | ICD-10-CM | POA: Diagnosis not present

## 2019-12-14 DIAGNOSIS — M9901 Segmental and somatic dysfunction of cervical region: Secondary | ICD-10-CM | POA: Diagnosis not present

## 2019-12-14 DIAGNOSIS — M546 Pain in thoracic spine: Secondary | ICD-10-CM | POA: Diagnosis not present

## 2019-12-17 DIAGNOSIS — J3081 Allergic rhinitis due to animal (cat) (dog) hair and dander: Secondary | ICD-10-CM | POA: Diagnosis not present

## 2019-12-17 DIAGNOSIS — J301 Allergic rhinitis due to pollen: Secondary | ICD-10-CM | POA: Diagnosis not present

## 2019-12-17 DIAGNOSIS — J3089 Other allergic rhinitis: Secondary | ICD-10-CM | POA: Diagnosis not present

## 2019-12-21 DIAGNOSIS — Z0000 Encounter for general adult medical examination without abnormal findings: Secondary | ICD-10-CM | POA: Diagnosis not present

## 2019-12-21 DIAGNOSIS — E785 Hyperlipidemia, unspecified: Secondary | ICD-10-CM | POA: Diagnosis not present

## 2019-12-21 DIAGNOSIS — R7301 Impaired fasting glucose: Secondary | ICD-10-CM | POA: Diagnosis not present

## 2019-12-21 DIAGNOSIS — Z125 Encounter for screening for malignant neoplasm of prostate: Secondary | ICD-10-CM | POA: Diagnosis not present

## 2019-12-22 DIAGNOSIS — M546 Pain in thoracic spine: Secondary | ICD-10-CM | POA: Diagnosis not present

## 2019-12-22 DIAGNOSIS — M9901 Segmental and somatic dysfunction of cervical region: Secondary | ICD-10-CM | POA: Diagnosis not present

## 2019-12-22 DIAGNOSIS — M9902 Segmental and somatic dysfunction of thoracic region: Secondary | ICD-10-CM | POA: Diagnosis not present

## 2019-12-22 DIAGNOSIS — M542 Cervicalgia: Secondary | ICD-10-CM | POA: Diagnosis not present

## 2019-12-24 DIAGNOSIS — J3081 Allergic rhinitis due to animal (cat) (dog) hair and dander: Secondary | ICD-10-CM | POA: Diagnosis not present

## 2019-12-24 DIAGNOSIS — J301 Allergic rhinitis due to pollen: Secondary | ICD-10-CM | POA: Diagnosis not present

## 2019-12-24 DIAGNOSIS — J3089 Other allergic rhinitis: Secondary | ICD-10-CM | POA: Diagnosis not present

## 2019-12-27 DIAGNOSIS — Z1331 Encounter for screening for depression: Secondary | ICD-10-CM | POA: Diagnosis not present

## 2019-12-27 DIAGNOSIS — R82998 Other abnormal findings in urine: Secondary | ICD-10-CM | POA: Diagnosis not present

## 2019-12-27 DIAGNOSIS — Z1389 Encounter for screening for other disorder: Secondary | ICD-10-CM | POA: Diagnosis not present

## 2019-12-27 DIAGNOSIS — E785 Hyperlipidemia, unspecified: Secondary | ICD-10-CM | POA: Diagnosis not present

## 2019-12-27 DIAGNOSIS — Z23 Encounter for immunization: Secondary | ICD-10-CM | POA: Diagnosis not present

## 2019-12-27 DIAGNOSIS — Z0000 Encounter for general adult medical examination without abnormal findings: Secondary | ICD-10-CM | POA: Diagnosis not present

## 2019-12-27 DIAGNOSIS — I10 Essential (primary) hypertension: Secondary | ICD-10-CM | POA: Diagnosis not present

## 2019-12-30 DIAGNOSIS — J301 Allergic rhinitis due to pollen: Secondary | ICD-10-CM | POA: Diagnosis not present

## 2019-12-30 DIAGNOSIS — J3081 Allergic rhinitis due to animal (cat) (dog) hair and dander: Secondary | ICD-10-CM | POA: Diagnosis not present

## 2019-12-31 DIAGNOSIS — J301 Allergic rhinitis due to pollen: Secondary | ICD-10-CM | POA: Diagnosis not present

## 2019-12-31 DIAGNOSIS — J3089 Other allergic rhinitis: Secondary | ICD-10-CM | POA: Diagnosis not present

## 2019-12-31 DIAGNOSIS — J3081 Allergic rhinitis due to animal (cat) (dog) hair and dander: Secondary | ICD-10-CM | POA: Diagnosis not present

## 2020-01-19 DIAGNOSIS — M47819 Spondylosis without myelopathy or radiculopathy, site unspecified: Secondary | ICD-10-CM | POA: Diagnosis not present

## 2020-01-27 DIAGNOSIS — J301 Allergic rhinitis due to pollen: Secondary | ICD-10-CM | POA: Diagnosis not present

## 2020-01-27 DIAGNOSIS — J3081 Allergic rhinitis due to animal (cat) (dog) hair and dander: Secondary | ICD-10-CM | POA: Diagnosis not present

## 2020-01-27 DIAGNOSIS — J3089 Other allergic rhinitis: Secondary | ICD-10-CM | POA: Diagnosis not present

## 2020-02-02 DIAGNOSIS — J3089 Other allergic rhinitis: Secondary | ICD-10-CM | POA: Diagnosis not present

## 2020-02-02 DIAGNOSIS — J3081 Allergic rhinitis due to animal (cat) (dog) hair and dander: Secondary | ICD-10-CM | POA: Diagnosis not present

## 2020-02-02 DIAGNOSIS — J301 Allergic rhinitis due to pollen: Secondary | ICD-10-CM | POA: Diagnosis not present

## 2020-02-04 DIAGNOSIS — J3089 Other allergic rhinitis: Secondary | ICD-10-CM | POA: Diagnosis not present

## 2020-02-04 DIAGNOSIS — J301 Allergic rhinitis due to pollen: Secondary | ICD-10-CM | POA: Diagnosis not present

## 2020-02-04 DIAGNOSIS — J3081 Allergic rhinitis due to animal (cat) (dog) hair and dander: Secondary | ICD-10-CM | POA: Diagnosis not present

## 2020-02-10 DIAGNOSIS — J301 Allergic rhinitis due to pollen: Secondary | ICD-10-CM | POA: Diagnosis not present

## 2020-02-10 DIAGNOSIS — J3089 Other allergic rhinitis: Secondary | ICD-10-CM | POA: Diagnosis not present

## 2020-02-10 DIAGNOSIS — J3081 Allergic rhinitis due to animal (cat) (dog) hair and dander: Secondary | ICD-10-CM | POA: Diagnosis not present

## 2020-02-16 DIAGNOSIS — M542 Cervicalgia: Secondary | ICD-10-CM | POA: Diagnosis not present

## 2020-02-23 DIAGNOSIS — J020 Streptococcal pharyngitis: Secondary | ICD-10-CM | POA: Diagnosis not present

## 2020-02-23 DIAGNOSIS — R5383 Other fatigue: Secondary | ICD-10-CM | POA: Diagnosis not present

## 2020-02-23 DIAGNOSIS — J029 Acute pharyngitis, unspecified: Secondary | ICD-10-CM | POA: Diagnosis not present

## 2020-02-23 DIAGNOSIS — R059 Cough, unspecified: Secondary | ICD-10-CM | POA: Diagnosis not present

## 2020-03-06 DIAGNOSIS — M542 Cervicalgia: Secondary | ICD-10-CM | POA: Diagnosis not present

## 2020-03-10 DIAGNOSIS — J3081 Allergic rhinitis due to animal (cat) (dog) hair and dander: Secondary | ICD-10-CM | POA: Diagnosis not present

## 2020-03-10 DIAGNOSIS — J3089 Other allergic rhinitis: Secondary | ICD-10-CM | POA: Diagnosis not present

## 2020-03-10 DIAGNOSIS — J301 Allergic rhinitis due to pollen: Secondary | ICD-10-CM | POA: Diagnosis not present

## 2020-03-17 DIAGNOSIS — J3081 Allergic rhinitis due to animal (cat) (dog) hair and dander: Secondary | ICD-10-CM | POA: Diagnosis not present

## 2020-03-17 DIAGNOSIS — J301 Allergic rhinitis due to pollen: Secondary | ICD-10-CM | POA: Diagnosis not present

## 2020-03-17 DIAGNOSIS — J3089 Other allergic rhinitis: Secondary | ICD-10-CM | POA: Diagnosis not present

## 2020-03-21 DIAGNOSIS — M542 Cervicalgia: Secondary | ICD-10-CM | POA: Diagnosis not present

## 2020-03-24 DIAGNOSIS — J3081 Allergic rhinitis due to animal (cat) (dog) hair and dander: Secondary | ICD-10-CM | POA: Diagnosis not present

## 2020-03-24 DIAGNOSIS — J3089 Other allergic rhinitis: Secondary | ICD-10-CM | POA: Diagnosis not present

## 2020-03-24 DIAGNOSIS — J301 Allergic rhinitis due to pollen: Secondary | ICD-10-CM | POA: Diagnosis not present

## 2020-03-30 DIAGNOSIS — M542 Cervicalgia: Secondary | ICD-10-CM | POA: Diagnosis not present

## 2020-04-04 DIAGNOSIS — R972 Elevated prostate specific antigen [PSA]: Secondary | ICD-10-CM | POA: Diagnosis not present

## 2020-04-11 DIAGNOSIS — J3089 Other allergic rhinitis: Secondary | ICD-10-CM | POA: Diagnosis not present

## 2020-04-11 DIAGNOSIS — J301 Allergic rhinitis due to pollen: Secondary | ICD-10-CM | POA: Diagnosis not present

## 2020-04-11 DIAGNOSIS — J3081 Allergic rhinitis due to animal (cat) (dog) hair and dander: Secondary | ICD-10-CM | POA: Diagnosis not present

## 2020-04-21 DIAGNOSIS — J3089 Other allergic rhinitis: Secondary | ICD-10-CM | POA: Diagnosis not present

## 2020-04-21 DIAGNOSIS — J301 Allergic rhinitis due to pollen: Secondary | ICD-10-CM | POA: Diagnosis not present

## 2020-04-21 DIAGNOSIS — J3081 Allergic rhinitis due to animal (cat) (dog) hair and dander: Secondary | ICD-10-CM | POA: Diagnosis not present

## 2020-05-22 DIAGNOSIS — J3081 Allergic rhinitis due to animal (cat) (dog) hair and dander: Secondary | ICD-10-CM | POA: Diagnosis not present

## 2020-05-22 DIAGNOSIS — J3089 Other allergic rhinitis: Secondary | ICD-10-CM | POA: Diagnosis not present

## 2020-05-22 DIAGNOSIS — J301 Allergic rhinitis due to pollen: Secondary | ICD-10-CM | POA: Diagnosis not present

## 2020-06-02 DIAGNOSIS — J3089 Other allergic rhinitis: Secondary | ICD-10-CM | POA: Diagnosis not present

## 2020-06-02 DIAGNOSIS — J3081 Allergic rhinitis due to animal (cat) (dog) hair and dander: Secondary | ICD-10-CM | POA: Diagnosis not present

## 2020-06-02 DIAGNOSIS — J301 Allergic rhinitis due to pollen: Secondary | ICD-10-CM | POA: Diagnosis not present

## 2020-06-08 DIAGNOSIS — J3081 Allergic rhinitis due to animal (cat) (dog) hair and dander: Secondary | ICD-10-CM | POA: Diagnosis not present

## 2020-06-08 DIAGNOSIS — J301 Allergic rhinitis due to pollen: Secondary | ICD-10-CM | POA: Diagnosis not present

## 2020-06-08 DIAGNOSIS — J3089 Other allergic rhinitis: Secondary | ICD-10-CM | POA: Diagnosis not present

## 2020-06-22 DIAGNOSIS — R509 Fever, unspecified: Secondary | ICD-10-CM | POA: Diagnosis not present

## 2020-06-22 DIAGNOSIS — Z03818 Encounter for observation for suspected exposure to other biological agents ruled out: Secondary | ICD-10-CM | POA: Diagnosis not present

## 2020-06-30 DIAGNOSIS — J3089 Other allergic rhinitis: Secondary | ICD-10-CM | POA: Diagnosis not present

## 2020-06-30 DIAGNOSIS — J3081 Allergic rhinitis due to animal (cat) (dog) hair and dander: Secondary | ICD-10-CM | POA: Diagnosis not present

## 2020-06-30 DIAGNOSIS — J301 Allergic rhinitis due to pollen: Secondary | ICD-10-CM | POA: Diagnosis not present

## 2020-07-06 DIAGNOSIS — J3081 Allergic rhinitis due to animal (cat) (dog) hair and dander: Secondary | ICD-10-CM | POA: Diagnosis not present

## 2020-07-06 DIAGNOSIS — J301 Allergic rhinitis due to pollen: Secondary | ICD-10-CM | POA: Diagnosis not present

## 2020-07-06 DIAGNOSIS — J3089 Other allergic rhinitis: Secondary | ICD-10-CM | POA: Diagnosis not present

## 2020-07-14 DIAGNOSIS — J3081 Allergic rhinitis due to animal (cat) (dog) hair and dander: Secondary | ICD-10-CM | POA: Diagnosis not present

## 2020-07-14 DIAGNOSIS — J3089 Other allergic rhinitis: Secondary | ICD-10-CM | POA: Diagnosis not present

## 2020-07-14 DIAGNOSIS — J301 Allergic rhinitis due to pollen: Secondary | ICD-10-CM | POA: Diagnosis not present

## 2020-07-28 DIAGNOSIS — J3089 Other allergic rhinitis: Secondary | ICD-10-CM | POA: Diagnosis not present

## 2020-07-28 DIAGNOSIS — J3081 Allergic rhinitis due to animal (cat) (dog) hair and dander: Secondary | ICD-10-CM | POA: Diagnosis not present

## 2020-07-28 DIAGNOSIS — J301 Allergic rhinitis due to pollen: Secondary | ICD-10-CM | POA: Diagnosis not present

## 2020-08-11 DIAGNOSIS — J301 Allergic rhinitis due to pollen: Secondary | ICD-10-CM | POA: Diagnosis not present

## 2020-08-11 DIAGNOSIS — T783XXD Angioneurotic edema, subsequent encounter: Secondary | ICD-10-CM | POA: Diagnosis not present

## 2020-08-11 DIAGNOSIS — J3081 Allergic rhinitis due to animal (cat) (dog) hair and dander: Secondary | ICD-10-CM | POA: Diagnosis not present

## 2020-08-11 DIAGNOSIS — J3089 Other allergic rhinitis: Secondary | ICD-10-CM | POA: Diagnosis not present

## 2020-08-18 DIAGNOSIS — J3081 Allergic rhinitis due to animal (cat) (dog) hair and dander: Secondary | ICD-10-CM | POA: Diagnosis not present

## 2020-08-18 DIAGNOSIS — J301 Allergic rhinitis due to pollen: Secondary | ICD-10-CM | POA: Diagnosis not present

## 2020-08-18 DIAGNOSIS — J3089 Other allergic rhinitis: Secondary | ICD-10-CM | POA: Diagnosis not present

## 2020-08-24 DIAGNOSIS — J3081 Allergic rhinitis due to animal (cat) (dog) hair and dander: Secondary | ICD-10-CM | POA: Diagnosis not present

## 2020-08-24 DIAGNOSIS — J3089 Other allergic rhinitis: Secondary | ICD-10-CM | POA: Diagnosis not present

## 2020-08-24 DIAGNOSIS — J301 Allergic rhinitis due to pollen: Secondary | ICD-10-CM | POA: Diagnosis not present

## 2020-09-08 DIAGNOSIS — J3081 Allergic rhinitis due to animal (cat) (dog) hair and dander: Secondary | ICD-10-CM | POA: Diagnosis not present

## 2020-09-08 DIAGNOSIS — J3089 Other allergic rhinitis: Secondary | ICD-10-CM | POA: Diagnosis not present

## 2020-09-08 DIAGNOSIS — J301 Allergic rhinitis due to pollen: Secondary | ICD-10-CM | POA: Diagnosis not present

## 2020-09-28 DIAGNOSIS — J301 Allergic rhinitis due to pollen: Secondary | ICD-10-CM | POA: Diagnosis not present

## 2020-09-28 DIAGNOSIS — J3089 Other allergic rhinitis: Secondary | ICD-10-CM | POA: Diagnosis not present

## 2020-09-28 DIAGNOSIS — J3081 Allergic rhinitis due to animal (cat) (dog) hair and dander: Secondary | ICD-10-CM | POA: Diagnosis not present

## 2020-10-03 DIAGNOSIS — R972 Elevated prostate specific antigen [PSA]: Secondary | ICD-10-CM | POA: Diagnosis not present

## 2020-10-11 DIAGNOSIS — J3081 Allergic rhinitis due to animal (cat) (dog) hair and dander: Secondary | ICD-10-CM | POA: Diagnosis not present

## 2020-10-11 DIAGNOSIS — J3089 Other allergic rhinitis: Secondary | ICD-10-CM | POA: Diagnosis not present

## 2020-10-11 DIAGNOSIS — J301 Allergic rhinitis due to pollen: Secondary | ICD-10-CM | POA: Diagnosis not present

## 2020-10-19 DIAGNOSIS — J3089 Other allergic rhinitis: Secondary | ICD-10-CM | POA: Diagnosis not present

## 2020-10-19 DIAGNOSIS — J3081 Allergic rhinitis due to animal (cat) (dog) hair and dander: Secondary | ICD-10-CM | POA: Diagnosis not present

## 2020-10-19 DIAGNOSIS — J301 Allergic rhinitis due to pollen: Secondary | ICD-10-CM | POA: Diagnosis not present

## 2020-10-26 DIAGNOSIS — J301 Allergic rhinitis due to pollen: Secondary | ICD-10-CM | POA: Diagnosis not present

## 2020-10-26 DIAGNOSIS — J3081 Allergic rhinitis due to animal (cat) (dog) hair and dander: Secondary | ICD-10-CM | POA: Diagnosis not present

## 2020-10-26 DIAGNOSIS — J3089 Other allergic rhinitis: Secondary | ICD-10-CM | POA: Diagnosis not present

## 2020-10-31 DIAGNOSIS — J3081 Allergic rhinitis due to animal (cat) (dog) hair and dander: Secondary | ICD-10-CM | POA: Diagnosis not present

## 2020-10-31 DIAGNOSIS — J301 Allergic rhinitis due to pollen: Secondary | ICD-10-CM | POA: Diagnosis not present

## 2020-11-01 DIAGNOSIS — J3081 Allergic rhinitis due to animal (cat) (dog) hair and dander: Secondary | ICD-10-CM | POA: Diagnosis not present

## 2020-11-01 DIAGNOSIS — J3089 Other allergic rhinitis: Secondary | ICD-10-CM | POA: Diagnosis not present

## 2020-11-06 DIAGNOSIS — J3081 Allergic rhinitis due to animal (cat) (dog) hair and dander: Secondary | ICD-10-CM | POA: Diagnosis not present

## 2020-11-06 DIAGNOSIS — J301 Allergic rhinitis due to pollen: Secondary | ICD-10-CM | POA: Diagnosis not present

## 2020-11-06 DIAGNOSIS — J3089 Other allergic rhinitis: Secondary | ICD-10-CM | POA: Diagnosis not present

## 2020-11-16 DIAGNOSIS — J3081 Allergic rhinitis due to animal (cat) (dog) hair and dander: Secondary | ICD-10-CM | POA: Diagnosis not present

## 2020-11-16 DIAGNOSIS — J3089 Other allergic rhinitis: Secondary | ICD-10-CM | POA: Diagnosis not present

## 2020-11-16 DIAGNOSIS — J301 Allergic rhinitis due to pollen: Secondary | ICD-10-CM | POA: Diagnosis not present

## 2020-11-28 DIAGNOSIS — J3081 Allergic rhinitis due to animal (cat) (dog) hair and dander: Secondary | ICD-10-CM | POA: Diagnosis not present

## 2020-11-28 DIAGNOSIS — J301 Allergic rhinitis due to pollen: Secondary | ICD-10-CM | POA: Diagnosis not present

## 2020-11-28 DIAGNOSIS — J3089 Other allergic rhinitis: Secondary | ICD-10-CM | POA: Diagnosis not present

## 2020-12-08 DIAGNOSIS — J3081 Allergic rhinitis due to animal (cat) (dog) hair and dander: Secondary | ICD-10-CM | POA: Diagnosis not present

## 2020-12-08 DIAGNOSIS — J3089 Other allergic rhinitis: Secondary | ICD-10-CM | POA: Diagnosis not present

## 2020-12-08 DIAGNOSIS — J301 Allergic rhinitis due to pollen: Secondary | ICD-10-CM | POA: Diagnosis not present

## 2020-12-15 DIAGNOSIS — J3089 Other allergic rhinitis: Secondary | ICD-10-CM | POA: Diagnosis not present

## 2020-12-15 DIAGNOSIS — J301 Allergic rhinitis due to pollen: Secondary | ICD-10-CM | POA: Diagnosis not present

## 2020-12-15 DIAGNOSIS — J3081 Allergic rhinitis due to animal (cat) (dog) hair and dander: Secondary | ICD-10-CM | POA: Diagnosis not present

## 2020-12-25 DIAGNOSIS — J3089 Other allergic rhinitis: Secondary | ICD-10-CM | POA: Diagnosis not present

## 2020-12-25 DIAGNOSIS — J3081 Allergic rhinitis due to animal (cat) (dog) hair and dander: Secondary | ICD-10-CM | POA: Diagnosis not present

## 2020-12-25 DIAGNOSIS — J301 Allergic rhinitis due to pollen: Secondary | ICD-10-CM | POA: Diagnosis not present

## 2021-01-04 DIAGNOSIS — J3089 Other allergic rhinitis: Secondary | ICD-10-CM | POA: Diagnosis not present

## 2021-01-04 DIAGNOSIS — J3081 Allergic rhinitis due to animal (cat) (dog) hair and dander: Secondary | ICD-10-CM | POA: Diagnosis not present

## 2021-01-04 DIAGNOSIS — J301 Allergic rhinitis due to pollen: Secondary | ICD-10-CM | POA: Diagnosis not present

## 2021-01-12 DIAGNOSIS — J3089 Other allergic rhinitis: Secondary | ICD-10-CM | POA: Diagnosis not present

## 2021-01-12 DIAGNOSIS — J3081 Allergic rhinitis due to animal (cat) (dog) hair and dander: Secondary | ICD-10-CM | POA: Diagnosis not present

## 2021-01-12 DIAGNOSIS — J301 Allergic rhinitis due to pollen: Secondary | ICD-10-CM | POA: Diagnosis not present

## 2021-01-15 DIAGNOSIS — Z125 Encounter for screening for malignant neoplasm of prostate: Secondary | ICD-10-CM | POA: Diagnosis not present

## 2021-01-15 DIAGNOSIS — E785 Hyperlipidemia, unspecified: Secondary | ICD-10-CM | POA: Diagnosis not present

## 2021-01-15 DIAGNOSIS — R7301 Impaired fasting glucose: Secondary | ICD-10-CM | POA: Diagnosis not present

## 2021-01-22 DIAGNOSIS — R82998 Other abnormal findings in urine: Secondary | ICD-10-CM | POA: Diagnosis not present

## 2021-01-22 DIAGNOSIS — Z1331 Encounter for screening for depression: Secondary | ICD-10-CM | POA: Diagnosis not present

## 2021-01-22 DIAGNOSIS — I10 Essential (primary) hypertension: Secondary | ICD-10-CM | POA: Diagnosis not present

## 2021-01-22 DIAGNOSIS — Z1339 Encounter for screening examination for other mental health and behavioral disorders: Secondary | ICD-10-CM | POA: Diagnosis not present

## 2021-01-22 DIAGNOSIS — Z0000 Encounter for general adult medical examination without abnormal findings: Secondary | ICD-10-CM | POA: Diagnosis not present

## 2021-01-22 DIAGNOSIS — Z23 Encounter for immunization: Secondary | ICD-10-CM | POA: Diagnosis not present

## 2021-01-26 DIAGNOSIS — J3081 Allergic rhinitis due to animal (cat) (dog) hair and dander: Secondary | ICD-10-CM | POA: Diagnosis not present

## 2021-01-26 DIAGNOSIS — J3089 Other allergic rhinitis: Secondary | ICD-10-CM | POA: Diagnosis not present

## 2021-01-26 DIAGNOSIS — J301 Allergic rhinitis due to pollen: Secondary | ICD-10-CM | POA: Diagnosis not present

## 2021-01-29 DIAGNOSIS — H40013 Open angle with borderline findings, low risk, bilateral: Secondary | ICD-10-CM | POA: Diagnosis not present

## 2021-02-09 DIAGNOSIS — J301 Allergic rhinitis due to pollen: Secondary | ICD-10-CM | POA: Diagnosis not present

## 2021-02-09 DIAGNOSIS — J3089 Other allergic rhinitis: Secondary | ICD-10-CM | POA: Diagnosis not present

## 2021-02-09 DIAGNOSIS — J3081 Allergic rhinitis due to animal (cat) (dog) hair and dander: Secondary | ICD-10-CM | POA: Diagnosis not present

## 2021-02-15 DIAGNOSIS — J3081 Allergic rhinitis due to animal (cat) (dog) hair and dander: Secondary | ICD-10-CM | POA: Diagnosis not present

## 2021-02-15 DIAGNOSIS — J3089 Other allergic rhinitis: Secondary | ICD-10-CM | POA: Diagnosis not present

## 2021-02-15 DIAGNOSIS — J301 Allergic rhinitis due to pollen: Secondary | ICD-10-CM | POA: Diagnosis not present

## 2021-02-21 DIAGNOSIS — J3089 Other allergic rhinitis: Secondary | ICD-10-CM | POA: Diagnosis not present

## 2021-02-21 DIAGNOSIS — J301 Allergic rhinitis due to pollen: Secondary | ICD-10-CM | POA: Diagnosis not present

## 2021-02-21 DIAGNOSIS — J3081 Allergic rhinitis due to animal (cat) (dog) hair and dander: Secondary | ICD-10-CM | POA: Diagnosis not present

## 2021-02-23 DIAGNOSIS — I10 Essential (primary) hypertension: Secondary | ICD-10-CM | POA: Diagnosis not present

## 2021-02-23 DIAGNOSIS — M79675 Pain in left toe(s): Secondary | ICD-10-CM | POA: Diagnosis not present

## 2021-02-28 DIAGNOSIS — S9782XA Crushing injury of left foot, initial encounter: Secondary | ICD-10-CM | POA: Diagnosis not present

## 2021-02-28 DIAGNOSIS — M79672 Pain in left foot: Secondary | ICD-10-CM | POA: Diagnosis not present

## 2021-03-06 DIAGNOSIS — J301 Allergic rhinitis due to pollen: Secondary | ICD-10-CM | POA: Diagnosis not present

## 2021-03-06 DIAGNOSIS — J3081 Allergic rhinitis due to animal (cat) (dog) hair and dander: Secondary | ICD-10-CM | POA: Diagnosis not present

## 2021-03-06 DIAGNOSIS — J3089 Other allergic rhinitis: Secondary | ICD-10-CM | POA: Diagnosis not present

## 2021-03-21 DIAGNOSIS — J3081 Allergic rhinitis due to animal (cat) (dog) hair and dander: Secondary | ICD-10-CM | POA: Diagnosis not present

## 2021-03-21 DIAGNOSIS — J3089 Other allergic rhinitis: Secondary | ICD-10-CM | POA: Diagnosis not present

## 2021-03-21 DIAGNOSIS — J301 Allergic rhinitis due to pollen: Secondary | ICD-10-CM | POA: Diagnosis not present

## 2021-03-29 DIAGNOSIS — J3081 Allergic rhinitis due to animal (cat) (dog) hair and dander: Secondary | ICD-10-CM | POA: Diagnosis not present

## 2021-03-29 DIAGNOSIS — J301 Allergic rhinitis due to pollen: Secondary | ICD-10-CM | POA: Diagnosis not present

## 2021-03-29 DIAGNOSIS — J3089 Other allergic rhinitis: Secondary | ICD-10-CM | POA: Diagnosis not present

## 2021-04-03 DIAGNOSIS — J3081 Allergic rhinitis due to animal (cat) (dog) hair and dander: Secondary | ICD-10-CM | POA: Diagnosis not present

## 2021-04-03 DIAGNOSIS — J3089 Other allergic rhinitis: Secondary | ICD-10-CM | POA: Diagnosis not present

## 2021-04-03 DIAGNOSIS — J301 Allergic rhinitis due to pollen: Secondary | ICD-10-CM | POA: Diagnosis not present

## 2021-04-06 DIAGNOSIS — S9782XA Crushing injury of left foot, initial encounter: Secondary | ICD-10-CM | POA: Diagnosis not present

## 2021-04-06 DIAGNOSIS — M79672 Pain in left foot: Secondary | ICD-10-CM | POA: Diagnosis not present

## 2021-04-13 DIAGNOSIS — J3089 Other allergic rhinitis: Secondary | ICD-10-CM | POA: Diagnosis not present

## 2021-04-13 DIAGNOSIS — J3081 Allergic rhinitis due to animal (cat) (dog) hair and dander: Secondary | ICD-10-CM | POA: Diagnosis not present

## 2021-04-13 DIAGNOSIS — J301 Allergic rhinitis due to pollen: Secondary | ICD-10-CM | POA: Diagnosis not present

## 2021-04-17 DIAGNOSIS — J301 Allergic rhinitis due to pollen: Secondary | ICD-10-CM | POA: Diagnosis not present

## 2021-04-17 DIAGNOSIS — J3089 Other allergic rhinitis: Secondary | ICD-10-CM | POA: Diagnosis not present

## 2021-04-17 DIAGNOSIS — J3081 Allergic rhinitis due to animal (cat) (dog) hair and dander: Secondary | ICD-10-CM | POA: Diagnosis not present

## 2021-05-04 DIAGNOSIS — S9782XA Crushing injury of left foot, initial encounter: Secondary | ICD-10-CM | POA: Diagnosis not present

## 2021-05-04 DIAGNOSIS — M79672 Pain in left foot: Secondary | ICD-10-CM | POA: Diagnosis not present

## 2021-05-18 DIAGNOSIS — R109 Unspecified abdominal pain: Secondary | ICD-10-CM | POA: Diagnosis not present

## 2021-05-18 DIAGNOSIS — M545 Low back pain, unspecified: Secondary | ICD-10-CM | POA: Diagnosis not present

## 2021-05-18 DIAGNOSIS — R972 Elevated prostate specific antigen [PSA]: Secondary | ICD-10-CM | POA: Diagnosis not present

## 2021-05-20 ENCOUNTER — Encounter (HOSPITAL_BASED_OUTPATIENT_CLINIC_OR_DEPARTMENT_OTHER): Payer: Self-pay | Admitting: Emergency Medicine

## 2021-05-20 ENCOUNTER — Other Ambulatory Visit: Payer: Self-pay

## 2021-05-20 ENCOUNTER — Emergency Department (HOSPITAL_BASED_OUTPATIENT_CLINIC_OR_DEPARTMENT_OTHER): Payer: BC Managed Care – PPO

## 2021-05-20 ENCOUNTER — Emergency Department (HOSPITAL_BASED_OUTPATIENT_CLINIC_OR_DEPARTMENT_OTHER)
Admission: EM | Admit: 2021-05-20 | Discharge: 2021-05-20 | Disposition: A | Discharge status: Home / Self Care | Payer: BC Managed Care – PPO | Attending: Emergency Medicine | Admitting: Emergency Medicine

## 2021-05-20 DIAGNOSIS — K5730 Diverticulosis of large intestine without perforation or abscess without bleeding: Secondary | ICD-10-CM | POA: Diagnosis not present

## 2021-05-20 DIAGNOSIS — K449 Diaphragmatic hernia without obstruction or gangrene: Secondary | ICD-10-CM | POA: Diagnosis not present

## 2021-05-20 DIAGNOSIS — N132 Hydronephrosis with renal and ureteral calculous obstruction: Secondary | ICD-10-CM | POA: Diagnosis not present

## 2021-05-20 DIAGNOSIS — N201 Calculus of ureter: Secondary | ICD-10-CM

## 2021-05-20 DIAGNOSIS — N179 Acute kidney failure, unspecified: Secondary | ICD-10-CM | POA: Insufficient documentation

## 2021-05-20 DIAGNOSIS — N400 Enlarged prostate without lower urinary tract symptoms: Secondary | ICD-10-CM | POA: Diagnosis not present

## 2021-05-20 DIAGNOSIS — N202 Calculus of kidney with calculus of ureter: Secondary | ICD-10-CM | POA: Diagnosis not present

## 2021-05-20 DIAGNOSIS — R109 Unspecified abdominal pain: Secondary | ICD-10-CM | POA: Diagnosis not present

## 2021-05-20 LAB — CBC
HCT: 42.7 % (ref 39.0–52.0)
Hemoglobin: 14.1 g/dL (ref 13.0–17.0)
MCH: 29.7 pg (ref 26.0–34.0)
MCHC: 33 g/dL (ref 30.0–36.0)
MCV: 89.9 fL (ref 80.0–100.0)
Platelets: 307 10*3/uL (ref 150–400)
RBC: 4.75 MIL/uL (ref 4.22–5.81)
RDW: 12.3 % (ref 11.5–15.5)
WBC: 12.2 10*3/uL — ABNORMAL HIGH (ref 4.0–10.5)
nRBC: 0 % (ref 0.0–0.2)

## 2021-05-20 LAB — URINALYSIS, ROUTINE W REFLEX MICROSCOPIC
Bilirubin Urine: NEGATIVE
Glucose, UA: NEGATIVE mg/dL
Hgb urine dipstick: NEGATIVE
Leukocytes,Ua: NEGATIVE
Nitrite: NEGATIVE
Specific Gravity, Urine: 1.022 (ref 1.005–1.030)
pH: 5.5 (ref 5.0–8.0)

## 2021-05-20 LAB — COMPREHENSIVE METABOLIC PANEL
ALT: 26 U/L (ref 0–44)
AST: 25 U/L (ref 15–41)
Albumin: 4.8 g/dL (ref 3.5–5.0)
Alkaline Phosphatase: 55 U/L (ref 38–126)
Anion gap: 13 (ref 5–15)
BUN: 19 mg/dL (ref 6–20)
CO2: 27 mmol/L (ref 22–32)
Calcium: 10.3 mg/dL (ref 8.9–10.3)
Chloride: 100 mmol/L (ref 98–111)
Creatinine, Ser: 1.79 mg/dL — ABNORMAL HIGH (ref 0.61–1.24)
GFR, Estimated: 43 mL/min — ABNORMAL LOW (ref >60)
Glucose, Bld: 96 mg/dL (ref 70–99)
Potassium: 3.1 mmol/L — ABNORMAL LOW (ref 3.5–5.1)
Sodium: 140 mmol/L (ref 135–145)
Total Bilirubin: 0.8 mg/dL (ref 0.3–1.2)
Total Protein: 8.5 g/dL — ABNORMAL HIGH (ref 6.5–8.1)

## 2021-05-20 LAB — LIPASE, BLOOD: Lipase: 15 U/L (ref 11–51)

## 2021-05-20 MED ORDER — OXYCODONE-ACETAMINOPHEN 7.5-325 MG PO TABS: 1.0000 | ORAL_TABLET | Freq: Four times a day (QID) | ORAL | 0 refills | Status: DC | PRN

## 2021-05-20 MED ORDER — IBUPROFEN 600 MG PO TABS: 600.0000 mg | ORAL_TABLET | Freq: Four times a day (QID) | ORAL | 0 refills | Status: DC | PRN

## 2021-05-20 MED ORDER — TAMSULOSIN HCL 0.4 MG PO CAPS: 0.4000 mg | ORAL_CAPSULE | Freq: Every day | ORAL | 0 refills | Status: DC

## 2021-05-20 MED ORDER — ONDANSETRON HCL 4 MG/2ML IJ SOLN
4.0000 mg | Freq: Once | INTRAMUSCULAR | Status: AC
  Administered 2021-05-20: 4 mg via INTRAVENOUS
  Filled 2021-05-20: qty 2

## 2021-05-20 MED ORDER — SODIUM CHLORIDE 0.9 % IV BOLUS
1000.0000 mL | Freq: Once | INTRAVENOUS | Status: AC
  Administered 2021-05-20: 1000 mL via INTRAVENOUS

## 2021-05-20 MED ORDER — KETOROLAC TROMETHAMINE 15 MG/ML IJ SOLN
15.0000 mg | Freq: Once | INTRAMUSCULAR | Status: AC
  Administered 2021-05-20: 15 mg via INTRAVENOUS
  Filled 2021-05-20: qty 1

## 2021-05-20 NOTE — ED Provider Notes (Addendum)
?[facility] EM[facility] DEPT ?Provider Note ? ? ?CSN: [id number] ?Arrival date & time: 05/20/21  1624 ? ?  ? ?History ? ?Chief Complaint  ?Patient presents with  ? Flank Pain  ? ? ?Jesse Horne is a 61 y.o. male. ? ?Patient is a 61 year old male presenting for flank pain.  Patient admits to left-sided flank pain with radiation to the left lower abdomen and left pelvis.  Denies hematuria.  Denies nausea or vomiting.  Denies fever or chills.  Remote history of nephrolithiasis. ? ?The history is provided by the patient. No language interpreter was used.  ?Flank Pain ?Pertinent negatives include no chest pain, no abdominal pain and no shortness of breath.  ? ?  ? ?Home Medications ?Prior to Admission medications   ?Medication Sig Start Date End Date Taking? Authorizing Provider  ?ibuprofen (ADVIL) 600 MG tablet Take 1 tablet (600 mg total) by mouth every 6 (six) hours as needed for mild pain. 05/20/21  Yes Edwin Dada P, DO  ?oxyCODONE-acetaminophen (PERCOCET) 7.5-325 MG tablet Take 1 tablet by mouth every 6 (six) hours as needed for severe pain. 05/20/21  Yes Edwin Dada P, DO  ?tamsulosin (FLOMAX) 0.4 MG CAPS capsule Take 1 capsule (0.4 mg total) by mouth daily. 05/20/21  Yes Edwin Dada P, DO  ?atorvastatin (LIPITOR) 20 MG tablet TAKE 1 TABLET BY MOUTH EVERY DAY 03/10/18   Gordy Savers, MD  ?azelastine (ASTELIN) 0.1 % nasal spray Place 1 spray into both nostrils 2 (two) times daily. 07/21/15   [provider]  ?cephALEXin (KEFLEX) 500 MG capsule Take 1 capsule (500 mg total) by mouth 4 (four) times daily. 12/15/17   Roderick Pee, MD  ?Regan Lemming 137-50 MCG/ACT SUSP SPRAY 1 SPRAY INTO EACH NOSTRIL TWICE A DAY 02/05/17   [provider]  ?EPIPEN 2-PAK 0.3 MG/0.3ML SOAJ as needed.  07/09/12   [provider]  ?levocetirizine (XYZAL) 5 MG tablet Take 5 mg by mouth every morning.  12/01/12   [provider]  ?mometasone (NASONEX) 50 MCG/ACT nasal spray Place 2 sprays  into the nose as needed.    [provider]  ?montelukast (SINGULAIR) 10 MG tablet Take 10 mg by mouth daily. 03/22/17   [provider]  ?Psyllium-Calcium (METAMUCIL PLUS CALCIUM) CAPS Take 8 capsules by mouth daily.    [provider]  ?valsartan-hydrochlorothiazide (DIOVAN-HCT) 160-12.5 MG tablet TAKE 1 TABLET BY MOUTH DAILY. 03/10/18   Gordy Savers, MD  ?vitamin C (ASCORBIC ACID) 500 MG tablet Take 500 mg by mouth daily.    [provider]  ?vitamin E 400 UNIT capsule Take 400 Units by mouth daily.    [provider]  ?   ? ?Allergies    ?Patient has no known allergies.   ? ?Review of Systems   ?Review of Systems  ?Constitutional:  Negative for chills and fever.  ?HENT:  Negative for ear pain and sore throat.   ?Eyes:  Negative for pain and visual disturbance.  ?Respiratory:  Negative for cough and shortness of breath.   ?Cardiovascular:  Negative for chest pain and palpitations.  ?Gastrointestinal:  Negative for abdominal pain and vomiting.  ?Genitourinary:  Positive for flank pain. Negative for decreased urine volume, dysuria and hematuria.  ?Musculoskeletal:  Negative for arthralgias and back pain.  ?Skin:  Negative for color change and rash.  ?Neurological:  Negative for seizures and syncope.  ?All other systems reviewed and are negative. ? ?Physical Exam ?Updated Vital Signs ?BP (!) 150/82  Pulse 90   Temp 99.3 ?F (37.4 ?C) (Oral)   Resp 18   Ht 5\' 7"  (1.702 m)   Wt 86.2 kg   SpO2 100%   BMI 29.76 kg/m?  ?Physical Exam ?Vitals and nursing note reviewed.  ?Constitutional:   ?   General: He is not in acute distress. ?   Appearance: He is well-developed.  ?HENT:  ?   Head: Normocephalic and atraumatic.  ?Eyes:  ?   Conjunctiva/sclera: Conjunctivae normal.  ?Cardiovascular:  ?   Rate and Rhythm: Normal rate and regular rhythm.  ?   Heart sounds: No murmur heard. ?Pulmonary:  ?   Effort: Pulmonary effort is normal. No respiratory distress.  ?   Breath  sounds: Normal breath sounds.  ?Abdominal:  ?   Palpations: Abdomen is soft.  ?   Tenderness: There is no abdominal tenderness.  ?   Comments: Positive Lloyd sign on the left  ?Musculoskeletal:     ?   General: No swelling.  ?   Cervical back: Neck supple.  ?Skin: ?   General: Skin is warm and dry.  ?   Capillary Refill: Capillary refill takes less than 2 seconds.  ?Neurological:  ?   Mental Status: He is alert.  ?Psychiatric:     ?   Mood and Affect: Mood normal.  ? ? ?ED Results / Procedures / Treatments   ?Labs ?(all labs ordered are listed, but only abnormal results are displayed) ?Labs Reviewed  ?COMPREHENSIVE METABOLIC PANEL - Abnormal; Notable for the following components:  ?    Result Value  ? Potassium 3.1 (*)   ? Creatinine, Ser 1.79 (*)   ? Total Protein 8.5 (*)   ? GFR, Estimated 43 (*)   ? All other components within normal limits  ?CBC - Abnormal; Notable for the following components:  ? WBC 12.2 (*)   ? All other components within normal limits  ?URINALYSIS, ROUTINE W REFLEX MICROSCOPIC - Abnormal; Notable for the following components:  ? Ketones, ur TRACE (*)   ? Protein, ur TRACE (*)   ? All other components within normal limits  ?LIPASE, BLOOD  ? ? ?EKG ?None ? ?Radiology ?CT Renal Stone Study ? ?Result Date: 05/20/2021 ?CLINICAL DATA:  Left flank pain. EXAM: CT ABDOMEN AND PELVIS WITHOUT CONTRAST TECHNIQUE: Multidetector CT imaging of the abdomen and pelvis was performed following the standard protocol without IV contrast. RADIATION DOSE REDUCTION: This exam was performed according to the departmental dose-optimization program which includes automated exposure control, adjustment of the mA and/or kV according to patient size and/or use of iterative reconstruction technique. COMPARISON:  None. FINDINGS: Lower chest: No acute abnormality. Hepatobiliary: No focal liver abnormality is seen. No gallstones, gallbladder wall thickening, or biliary dilatation. Pancreas: Unremarkable. No pancreatic ductal  dilatation or surrounding inflammatory changes. Spleen: Normal in size without focal abnormality. Adrenals/Urinary Tract: Adrenal glands are unremarkable. Kidneys are normal in size, without focal lesions. A 5 mm obstructing renal stone is seen within the proximal left ureter with mild to moderate severity left-sided hydronephrosis and hydroureter. Bladder is unremarkable. Stomach/Bowel: There is a small hiatal hernia. Appendix appears normal. No evidence of bowel wall thickening, distention, or inflammatory changes. Noninflamed diverticula are seen throughout the large bowel. Vascular/Lymphatic: Aortic atherosclerosis. No enlarged abdominal or pelvic lymph nodes. Reproductive: Mild to moderate severity prostate gland enlargement is noted. Other: No abdominal wall hernia or abnormality. No abdominopelvic ascites. Musculoskeletal: No acute or significant osseous findings. IMPRESSION: 1. 5 mm obstructing renal  stone within the proximal left ureter. 2. Colonic diverticulosis. 3. Small hiatal hernia. 4. Mild to moderate severity prostate gland enlargement. 5. Aortic atherosclerosis. Aortic Atherosclerosis (ICD10-I70.0). Electronically Signed   By: Aram Candela M.D.   On: 05/20/2021 19:04   ? ?Procedures ?Procedures  ? ? ?Medications Ordered in ED ?Medications  ?sodium chloride 0.9 % bolus 1,000 mL (1,000 mLs Intravenous New Bag/Given 05/20/21 1902)  ?ketorolac (TORADOL) 15 MG/ML injection 15 mg (15 mg Intravenous Given 05/20/21 1902)  ?ondansetron [facility]) injection 4 mg (4 mg Intravenous Given 05/20/21 1902)  ? ? ?ED Course/ Medical Decision Making/ A&P ?  ?                        ?Medical Decision Making ?Amount and/or Complexity of Data Reviewed ?Labs: ordered. ?Radiology: ordered. ? ?Risk ?Prescription drug management. ? ? ?19:77 PM ? 61 year old male presenting for left-sided flank pain.  Patient is alert and oriented x3, no acute distress, afebrile, stable vital signs.  Physical exam demonstrates soft  nonperitoneal abdomen.  CT renal stone positive for 5 mm obstructing renal stone on the left.  Creatinine 1.75.  IV fluids given.  UA demonstrates no UTI.  Patient's pain improved with somatic management in the emergency de

## 2021-05-20 NOTE — ED Triage Notes (Signed)
Pt via pov from home with left flank pain x 3 days. Pt states he went to pcp on Friday and was told he was dehydrated and that his kidney function was decreased. They did not do any imaging, and told him he likely did not have stones due to no hematuria or signs of infection. They sent him home with flexeril, as well as something for spasms, telling him to drink more. Pt reports some improvement yesterday, but feels worse today. Pt alert & oriented, nad noted.  ?

## 2021-05-23 ENCOUNTER — Other Ambulatory Visit: Payer: Self-pay | Admitting: Urology

## 2021-05-23 DIAGNOSIS — R972 Elevated prostate specific antigen [PSA]: Secondary | ICD-10-CM | POA: Diagnosis not present

## 2021-05-23 DIAGNOSIS — N201 Calculus of ureter: Secondary | ICD-10-CM | POA: Diagnosis not present

## 2021-05-23 DIAGNOSIS — R8271 Bacteriuria: Secondary | ICD-10-CM | POA: Diagnosis not present

## 2021-05-23 DIAGNOSIS — R350 Frequency of micturition: Secondary | ICD-10-CM | POA: Diagnosis not present

## 2021-05-25 NOTE — Progress Notes (Signed)
Left voice mail to return call for instructions 

## 2021-05-25 NOTE — Progress Notes (Signed)
Received call from [facility] at [facility] stating patient should take his home antibiotic on Monday prior to procedure instead of receiving PO Cipro in pre-op. Attempted to call pt to notify him of instructions. No answer and no voicemail option. ?

## 2021-05-25 NOTE — Progress Notes (Signed)
Patient called back. Instructions given. Arrival time 0600 clear liquids until  0330. Wife is the driver Meds and hx reviewed.  ?

## 2021-05-28 ENCOUNTER — Other Ambulatory Visit: Payer: Self-pay

## 2021-05-28 ENCOUNTER — Encounter (HOSPITAL_BASED_OUTPATIENT_CLINIC_OR_DEPARTMENT_OTHER)
Admission: RE | Disposition: A | Discharge status: Home / Self Care | Payer: Self-pay | Source: Home / Self Care | Attending: Urology

## 2021-05-28 ENCOUNTER — Ambulatory Visit (HOSPITAL_COMMUNITY): Payer: BC Managed Care – PPO

## 2021-05-28 ENCOUNTER — Encounter (HOSPITAL_BASED_OUTPATIENT_CLINIC_OR_DEPARTMENT_OTHER): Payer: Self-pay | Admitting: Urology

## 2021-05-28 ENCOUNTER — Ambulatory Visit (HOSPITAL_BASED_OUTPATIENT_CLINIC_OR_DEPARTMENT_OTHER)
Admission: RE | Admit: 2021-05-28 | Discharge: 2021-05-28 | Disposition: A | Discharge status: Home / Self Care | Payer: BC Managed Care – PPO | Attending: Urology | Admitting: Urology

## 2021-05-28 DIAGNOSIS — N201 Calculus of ureter: Secondary | ICD-10-CM | POA: Insufficient documentation

## 2021-05-28 DIAGNOSIS — Z01818 Encounter for other preprocedural examination: Secondary | ICD-10-CM | POA: Diagnosis not present

## 2021-05-28 DIAGNOSIS — I878 Other specified disorders of veins: Secondary | ICD-10-CM | POA: Diagnosis not present

## 2021-05-28 HISTORY — PX: EXTRACORPOREAL SHOCK WAVE LITHOTRIPSY: SHX1557

## 2021-05-28 SURGERY — LITHOTRIPSY, ESWL
Anesthesia: LOCAL | Laterality: Left

## 2021-05-28 MED ORDER — DIPHENHYDRAMINE HCL 25 MG PO CAPS
25.0000 mg | ORAL_CAPSULE | ORAL | Status: AC
  Administered 2021-05-28: 25 mg via ORAL

## 2021-05-28 MED ORDER — DIAZEPAM 5 MG PO TABS
ORAL_TABLET | ORAL | Status: AC
  Filled 2021-05-28: qty 2

## 2021-05-28 MED ORDER — SODIUM CHLORIDE 0.9 % IV SOLN: INTRAVENOUS | Status: DC

## 2021-05-28 MED ORDER — DIPHENHYDRAMINE HCL 25 MG PO CAPS
ORAL_CAPSULE | ORAL | Status: AC
  Filled 2021-05-28: qty 1

## 2021-05-28 MED ORDER — DIAZEPAM 5 MG PO TABS
10.0000 mg | ORAL_TABLET | ORAL | Status: AC
  Administered 2021-05-28: 10 mg via ORAL

## 2021-05-28 NOTE — Brief Op Note (Signed)
05/28/2021 ? ?8:47 AM ? ?PATIENT:  Jesse Horne  61 y.o. male ? ?PRE-OPERATIVE DIAGNOSIS:  LEFT URETERAL STONE ? ?POST-OPERATIVE DIAGNOSIS:  * No post-op diagnosis entered * ? ?PROCEDURE:  Procedure(s): ?LEFT EXTRACORPOREAL SHOCK WAVE LITHOTRIPSY (ESWL) (Left) ? ?SURGEON:  Surgeon(s) and Role: ?   Alexis Frock, MD - Primary ? ?PHYSICIAN ASSISTANT:  ? ?ASSISTANTS: none  ? ?ANESTHESIA:   MAC ? ?EBL:  minimal  ? ?BLOOD ADMINISTERED:none ? ?DRAINS: none  ? ?LOCAL MEDICATIONS USED:  NONE ? ?SPECIMEN:  No Specimen ? ?DISPOSITION OF SPECIMEN:  N/A ? ?COUNTS:  YES ? ?TOURNIQUET:  * No tourniquets in log * ? ?DICTATION: .Note written in paper chart ? ?PLAN OF CARE: Discharge to home after PACU ? ?PATIENT DISPOSITION:  PACU - hemodynamically stable. ?  ?Delay start of Pharmacological VTE agent (>24hrs) due to surgical blood loss or risk of bleeding: yes ? ?

## 2021-05-28 NOTE — Discharge Instructions (Signed)
1 - You may have urinary urgency (bladder spasms), pass small stone fragments, and bloody urine on / off for up to 2 weeks. This is normal. ° °2 - Call MD or go to ER for fever >102, severe pain / nausea / vomiting not relieved by medications, or acute change in medical status ° °

## 2021-05-28 NOTE — H&P (Signed)
Jesse Horne is an 61 y.o. male.   ? ?Chief Complaint: Pre-OP LEFT Shockwave Lithotripsy ? ?HPI:  ? ?1 - LEFT Ureteral Stone - 24mm left prox stone by ER CT 04/2021. Solitary, at L2 transverse process area by CT and f/u KUB. UCX negative. ? ?Today "Jesse Horne" is seen to proceed with LEFT shockwave lithotripsy. No interval fevers.  ? ?Past Medical History:  ?Diagnosis Date  ? At risk for sleep apnea   ? STOP-BANG= 4    SENT TO PCP 12-24-2013  ? Family history of anesthesia complication   ? SISTER--  PONV  ? Hyperlipidemia   ? Internal hemorrhoid, bleeding   ? Seasonal and perennial allergic rhinitis   ? Wears glasses   ? ? ?Past Surgical History:  ?Procedure Laterality Date  ? COLONOSCOPY N/A 07/29/2012  ? Procedure: COLONOSCOPY;  Surgeon: Romie Levee, MD;  Location: [facility];  Service: Endoscopy;  Laterality: N/A;   ? TRANSANAL HEMORRHOIDAL DEARTERIALIZATION N/A 12/29/2013  ? Procedure: TRANSANAL HEMORRHOIDAL DEARTERIALIZATION;  Surgeon: Romie Levee, MD;  Location: [facility];  Service: General;  Laterality: N/A;  ? TRANSTHORACIC ECHOCARDIOGRAM  03-17-2008  ? mild focal septal hypertrophy/  ef 60%/  trivial TR  ? ? ?Family History  ?Problem Relation Age of Onset  ? Cancer Mother   ?     Melanoma, Basal Cell Carcinoma, Breast  ? Heart disease Father   ? Cancer Sister   ?     Breast  ? Heart disease Sister   ? ?Social History:  reports that he has never smoked. He has never used smokeless tobacco. He reports current alcohol use of about 2.0 standard drinks per week. He reports that he does not currently use drugs. ? ?Allergies: No Known Allergies ? ?Medications Prior to Admission  ?Medication Sig Dispense Refill  ? atorvastatin (LIPITOR) 20 MG tablet TAKE 1 TABLET BY MOUTH EVERY DAY 90 tablet 1  ? azelastine (ASTELIN) 0.1 % nasal spray Place 1 spray into both nostrils 2 (two) times daily.  3  ? cephALEXin (KEFLEX) 500 MG capsule Take 1 capsule (500 mg total) by mouth 4 (four) times daily. 30 capsule 1   ? DYMISTA 137-50 MCG/ACT SUSP SPRAY 1 SPRAY INTO EACH NOSTRIL TWICE A DAY  3  ? levocetirizine (XYZAL) 5 MG tablet Take 5 mg by mouth every morning.     ? mometasone (NASONEX) 50 MCG/ACT nasal spray Place 2 sprays into the nose as needed.    ? montelukast (SINGULAIR) 10 MG tablet Take 10 mg by mouth daily.  3  ? oxyCODONE-acetaminophen (PERCOCET) 7.5-325 MG tablet Take 1 tablet by mouth every 6 (six) hours as needed for severe pain. 30 tablet 0  ? Psyllium-Calcium (METAMUCIL PLUS CALCIUM) CAPS Take 8 capsules by mouth daily.    ? tamsulosin (FLOMAX) 0.4 MG CAPS capsule Take 1 capsule (0.4 mg total) by mouth daily. 30 capsule 0  ? valsartan-hydrochlorothiazide (DIOVAN-HCT) 160-12.5 MG tablet TAKE 1 TABLET BY MOUTH DAILY. 90 tablet 1  ? vitamin C (ASCORBIC ACID) 500 MG tablet Take 500 mg by mouth daily.    ? vitamin E 400 UNIT capsule Take 400 Units by mouth daily.    ? EPIPEN 2-PAK 0.3 MG/0.3ML SOAJ as needed.     ? ibuprofen (ADVIL) 600 MG tablet Take 1 tablet (600 mg total) by mouth every 6 (six) hours as needed for mild pain. 30 tablet 0  ? ? ?No results found for this or any previous visit (from the past  48 hour(s)). ?No results found. ? ?Review of Systems  ?Constitutional:  Negative for chills and fever.  ?Genitourinary:  Positive for flank pain.  ?All other systems reviewed and are negative. ? ?Blood pressure 127/80, pulse 93, temperature 98.6 ?F (37 ?C), temperature source Oral, resp. rate 15, height 5\' 7"  (1.702 m), weight 84.4 kg, SpO2 97 %. ?Physical Exam ?Vitals reviewed.  ?HENT:  ?   Head: Normocephalic.  ?   Mouth/Throat:  ?   Mouth: Mucous membranes are moist.  ?Eyes:  ?   Pupils: Pupils are equal, round, and reactive to light.  ?Cardiovascular:  ?   Rate and Rhythm: Normal rate.  ?Abdominal:  ?   General: Abdomen is flat.  ?   Comments: Significant truncal obesity  ?Musculoskeletal:  ?   Comments: Mild left CVAT at present  ?Skin: ?   General: Skin is warm.  ?Neurological:  ?   General: No focal  deficit present.  ?   Mental Status: He is alert.  ?Psychiatric:     ?   Mood and Affect: Mood normal.  ?  ? ?Assessment/Plan ? ?Proceed as planned with LEFT shockwave lithotripsy. RIsks, benefits, alternatives, expected peri-treatment course discussed.  ? ? , MD ?05/28/2021, 7:05 AM ? ? ? ?

## 2021-05-29 ENCOUNTER — Encounter (HOSPITAL_BASED_OUTPATIENT_CLINIC_OR_DEPARTMENT_OTHER): Payer: Self-pay | Admitting: Urology

## 2021-06-15 DIAGNOSIS — N201 Calculus of ureter: Secondary | ICD-10-CM | POA: Diagnosis not present

## 2021-07-31 DIAGNOSIS — N201 Calculus of ureter: Secondary | ICD-10-CM | POA: Diagnosis not present

## 2021-08-14 DIAGNOSIS — J3081 Allergic rhinitis due to animal (cat) (dog) hair and dander: Secondary | ICD-10-CM | POA: Diagnosis not present

## 2021-08-14 DIAGNOSIS — J301 Allergic rhinitis due to pollen: Secondary | ICD-10-CM | POA: Diagnosis not present

## 2021-08-15 DIAGNOSIS — J3081 Allergic rhinitis due to animal (cat) (dog) hair and dander: Secondary | ICD-10-CM | POA: Diagnosis not present

## 2021-08-15 DIAGNOSIS — J3089 Other allergic rhinitis: Secondary | ICD-10-CM | POA: Diagnosis not present

## 2021-08-16 DIAGNOSIS — M5451 Vertebrogenic low back pain: Secondary | ICD-10-CM | POA: Diagnosis not present

## 2021-08-22 DIAGNOSIS — M5416 Radiculopathy, lumbar region: Secondary | ICD-10-CM | POA: Diagnosis not present

## 2021-08-24 DIAGNOSIS — J301 Allergic rhinitis due to pollen: Secondary | ICD-10-CM | POA: Diagnosis not present

## 2021-08-24 DIAGNOSIS — J3081 Allergic rhinitis due to animal (cat) (dog) hair and dander: Secondary | ICD-10-CM | POA: Diagnosis not present

## 2021-08-24 DIAGNOSIS — T783XXD Angioneurotic edema, subsequent encounter: Secondary | ICD-10-CM | POA: Diagnosis not present

## 2021-08-24 DIAGNOSIS — J3089 Other allergic rhinitis: Secondary | ICD-10-CM | POA: Diagnosis not present

## 2021-08-27 DIAGNOSIS — S61011A Laceration without foreign body of right thumb without damage to nail, initial encounter: Secondary | ICD-10-CM | POA: Diagnosis not present

## 2021-08-27 DIAGNOSIS — M79641 Pain in right hand: Secondary | ICD-10-CM | POA: Diagnosis not present

## 2021-08-30 DIAGNOSIS — J301 Allergic rhinitis due to pollen: Secondary | ICD-10-CM | POA: Diagnosis not present

## 2021-08-30 DIAGNOSIS — J3089 Other allergic rhinitis: Secondary | ICD-10-CM | POA: Diagnosis not present

## 2021-08-30 DIAGNOSIS — J3081 Allergic rhinitis due to animal (cat) (dog) hair and dander: Secondary | ICD-10-CM | POA: Diagnosis not present

## 2021-09-04 DIAGNOSIS — J3089 Other allergic rhinitis: Secondary | ICD-10-CM | POA: Diagnosis not present

## 2021-09-04 DIAGNOSIS — J3081 Allergic rhinitis due to animal (cat) (dog) hair and dander: Secondary | ICD-10-CM | POA: Diagnosis not present

## 2021-09-04 DIAGNOSIS — J301 Allergic rhinitis due to pollen: Secondary | ICD-10-CM | POA: Diagnosis not present

## 2021-09-04 DIAGNOSIS — S61011A Laceration without foreign body of right thumb without damage to nail, initial encounter: Secondary | ICD-10-CM | POA: Diagnosis not present

## 2021-09-07 DIAGNOSIS — Y999 Unspecified external cause status: Secondary | ICD-10-CM | POA: Diagnosis not present

## 2021-09-07 DIAGNOSIS — X58XXXA Exposure to other specified factors, initial encounter: Secondary | ICD-10-CM | POA: Diagnosis not present

## 2021-09-07 DIAGNOSIS — S6431XA Injury of digital nerve of right thumb, initial encounter: Secondary | ICD-10-CM | POA: Diagnosis not present

## 2021-09-10 DIAGNOSIS — J3089 Other allergic rhinitis: Secondary | ICD-10-CM | POA: Diagnosis not present

## 2021-09-10 DIAGNOSIS — J301 Allergic rhinitis due to pollen: Secondary | ICD-10-CM | POA: Diagnosis not present

## 2021-09-10 DIAGNOSIS — J3081 Allergic rhinitis due to animal (cat) (dog) hair and dander: Secondary | ICD-10-CM | POA: Diagnosis not present

## 2021-09-13 DIAGNOSIS — M5136 Other intervertebral disc degeneration, lumbar region: Secondary | ICD-10-CM | POA: Diagnosis not present

## 2021-09-13 DIAGNOSIS — J3089 Other allergic rhinitis: Secondary | ICD-10-CM | POA: Diagnosis not present

## 2021-09-13 DIAGNOSIS — S6431XA Injury of digital nerve of right thumb, initial encounter: Secondary | ICD-10-CM | POA: Diagnosis not present

## 2021-09-13 DIAGNOSIS — R208 Other disturbances of skin sensation: Secondary | ICD-10-CM | POA: Diagnosis not present

## 2021-09-13 DIAGNOSIS — J3081 Allergic rhinitis due to animal (cat) (dog) hair and dander: Secondary | ICD-10-CM | POA: Diagnosis not present

## 2021-09-13 DIAGNOSIS — J301 Allergic rhinitis due to pollen: Secondary | ICD-10-CM | POA: Diagnosis not present

## 2021-09-13 DIAGNOSIS — S61011A Laceration without foreign body of right thumb without damage to nail, initial encounter: Secondary | ICD-10-CM | POA: Diagnosis not present

## 2021-09-13 DIAGNOSIS — M5451 Vertebrogenic low back pain: Secondary | ICD-10-CM | POA: Diagnosis not present

## 2021-09-19 DIAGNOSIS — J301 Allergic rhinitis due to pollen: Secondary | ICD-10-CM | POA: Diagnosis not present

## 2021-09-19 DIAGNOSIS — J3081 Allergic rhinitis due to animal (cat) (dog) hair and dander: Secondary | ICD-10-CM | POA: Diagnosis not present

## 2021-09-19 DIAGNOSIS — J3089 Other allergic rhinitis: Secondary | ICD-10-CM | POA: Diagnosis not present

## 2021-09-25 DIAGNOSIS — J3089 Other allergic rhinitis: Secondary | ICD-10-CM | POA: Diagnosis not present

## 2021-09-25 DIAGNOSIS — J3081 Allergic rhinitis due to animal (cat) (dog) hair and dander: Secondary | ICD-10-CM | POA: Diagnosis not present

## 2021-09-25 DIAGNOSIS — J301 Allergic rhinitis due to pollen: Secondary | ICD-10-CM | POA: Diagnosis not present

## 2021-09-28 DIAGNOSIS — J3089 Other allergic rhinitis: Secondary | ICD-10-CM | POA: Diagnosis not present

## 2021-09-28 DIAGNOSIS — J3081 Allergic rhinitis due to animal (cat) (dog) hair and dander: Secondary | ICD-10-CM | POA: Diagnosis not present

## 2021-09-28 DIAGNOSIS — J301 Allergic rhinitis due to pollen: Secondary | ICD-10-CM | POA: Diagnosis not present

## 2021-10-03 DIAGNOSIS — J3089 Other allergic rhinitis: Secondary | ICD-10-CM | POA: Diagnosis not present

## 2021-10-03 DIAGNOSIS — J301 Allergic rhinitis due to pollen: Secondary | ICD-10-CM | POA: Diagnosis not present

## 2021-10-03 DIAGNOSIS — J3081 Allergic rhinitis due to animal (cat) (dog) hair and dander: Secondary | ICD-10-CM | POA: Diagnosis not present

## 2021-10-05 ENCOUNTER — Encounter (HOSPITAL_COMMUNITY): Payer: Self-pay | Admitting: Internal Medicine

## 2021-10-05 ENCOUNTER — Ambulatory Visit (HOSPITAL_COMMUNITY)
Admission: RE | Admit: 2021-10-05 | Discharge: 2021-10-05 | Disposition: A | Discharge status: Home / Self Care | Payer: BC Managed Care – PPO | Source: Ambulatory Visit | Attending: Internal Medicine | Admitting: Internal Medicine

## 2021-10-05 VITALS — BP 130/88 | HR 89 | Wt 191.6 lb

## 2021-10-05 DIAGNOSIS — Z8249 Family history of ischemic heart disease and other diseases of the circulatory system: Secondary | ICD-10-CM | POA: Diagnosis not present

## 2021-10-05 DIAGNOSIS — I2510 Atherosclerotic heart disease of native coronary artery without angina pectoris: Secondary | ICD-10-CM | POA: Insufficient documentation

## 2021-10-05 DIAGNOSIS — R55 Syncope and collapse: Secondary | ICD-10-CM | POA: Insufficient documentation

## 2021-10-05 DIAGNOSIS — I10 Essential (primary) hypertension: Secondary | ICD-10-CM | POA: Diagnosis not present

## 2021-10-05 DIAGNOSIS — E785 Hyperlipidemia, unspecified: Secondary | ICD-10-CM | POA: Insufficient documentation

## 2021-10-05 NOTE — Progress Notes (Signed)
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 13 minutes on Bruce. Negative ECG.  ECHO 2010: EF 60%  CAC 1/20 = 11  Didn't exercise for a couple years due to COVID. Restarted in 3/23 at [facility]. Was doing 2 miles on TM, walking track and weights. No symptoms. Stopped exercising for a couple months due to hand injury and kidney stones.   Doing OK. No CP or SOB. No palpitations. Did have a presyncopal event on standing one day after having some ETOH the night before. Has not recurred  Father with CAD/MI in 2s. Now 98.5 y/o (12/19)   SH:  Social History   Socioeconomic History   Marital status: Married    Spouse name: Not on file   Number of children: Not on file   Years of education: Not on file   Highest education level: Not on file  Occupational History   Not on file  Tobacco Use   Smoking status: Never   Smokeless tobacco: Never  Vaping Use   Vaping Use: Never used  Substance and Sexual Activity   Alcohol use: Yes    Alcohol/week: 2.0 standard drinks of alcohol    Types: 2 Standard drinks or equivalent per week    Comment: occasional   Drug use: Not Currently   Sexual activity: Not on file  Other Topics Concern   Not on file  Social History Narrative   Not on file   Social Determinants of Health   Financial Resource Strain: Not on file  Food Insecurity: Not on file  Transportation Needs: Not on file  Physical Activity: Not on file  Stress: Not on file  Social Connections: Not on file  Intimate Partner Violence: Not on file    FH:  Family History  Problem Relation Age of Onset   Cancer Mother        Melanoma, Basal Cell Carcinoma, Breast   Heart disease Father    Cancer Sister        Breast   Heart disease Sister     Past Medical History:  Diagnosis Date   At risk for sleep apnea    STOP-BANG= 4    SENT TO PCP  12-24-2013   Family history of anesthesia complication    SISTER--  PONV   Hyperlipidemia    Internal hemorrhoid, bleeding    Seasonal and perennial allergic rhinitis    Wears glasses     Current Outpatient Medications  Medication Sig Dispense Refill   atorvastatin (LIPITOR) 20 MG tablet TAKE 1 TABLET BY MOUTH EVERY DAY 90 tablet 1   azelastine (ASTELIN) 0.1 % nasal spray Place 1 spray into both nostrils 2 (two) times daily.  3   DYMISTA 137-50 MCG/ACT SUSP SPRAY 1 SPRAY INTO EACH NOSTRIL TWICE A DAY  3   ibuprofen (ADVIL) 600 MG tablet Take 1 tablet (600 mg total) by mouth every 6 (six) hours as needed for mild pain. 30 tablet 0   levocetirizine (XYZAL) 5 MG tablet Take 5 mg by mouth every morning.      mometasone (NASONEX) 50 MCG/ACT nasal spray Place 2 sprays into the nose as needed.     Psyllium-Calcium (METAMUCIL PLUS CALCIUM) CAPS Take 8 capsules by mouth daily.     valsartan-hydrochlorothiazide (DIOVAN-HCT) 160-12.5 MG tablet TAKE 1 TABLET BY MOUTH DAILY. 90 tablet 1  vitamin C (ASCORBIC ACID) 500 MG tablet Take 500 mg by mouth daily.     vitamin E 400 UNIT capsule Take 400 Units by mouth daily.     EPIPEN 2-PAK 0.3 MG/0.3ML SOAJ as needed.  (Patient not taking: Reported on 10/05/2021)     No current facility-administered medications for this encounter.    Vitals:   10/05/21 0951  BP: 130/88  Pulse: 89  SpO2: 94%  Weight: 86.9 kg (191 lb 9.6 oz)    PHYSICAL EXAM:  General:  Well appearing. No resp difficulty HEENT: normal Neck: supple. no JVD. Carotids 2+ bilat; no bruits. No lymphadenopathy or thryomegaly appreciated. Cor: PMI nondisplaced. Regular rate & rhythm. No rubs, gallops or murmurs. Lungs: clear Abdomen: soft, nontender, nondistended. No hepatosplenomegaly. No bruits or masses. Good bowel sounds. Extremities: no cyanosis, clubbing, rash, edema R hand thumb splint Neuro: alert & orientedx3, cranial nerves grossly intact. moves all 4 extremities w/o  difficulty. Affect pleasant   ECG: NSR 79 No ST-T wave abnormalities.     ASSESSMENT & PLAN:  1. HTN - BP well controlled  2. HL - Followed by PCP - Continue statin  3. CV risk - CAC 1/20 = 11 - repeat coronary calcium scoring. If delta CAC score is high will need stress testing vc coronary CTA - continue RF modification  4. Presyncopal episode - likely due to volume depletion - stay hydrated. - no further w/u needed   Arvilla Meres, MD  9:52 AM

## 2021-10-10 DIAGNOSIS — J3081 Allergic rhinitis due to animal (cat) (dog) hair and dander: Secondary | ICD-10-CM | POA: Diagnosis not present

## 2021-10-10 DIAGNOSIS — J3089 Other allergic rhinitis: Secondary | ICD-10-CM | POA: Diagnosis not present

## 2021-10-10 DIAGNOSIS — J301 Allergic rhinitis due to pollen: Secondary | ICD-10-CM | POA: Diagnosis not present

## 2021-10-12 DIAGNOSIS — J3081 Allergic rhinitis due to animal (cat) (dog) hair and dander: Secondary | ICD-10-CM | POA: Diagnosis not present

## 2021-10-12 DIAGNOSIS — J3089 Other allergic rhinitis: Secondary | ICD-10-CM | POA: Diagnosis not present

## 2021-10-12 DIAGNOSIS — J301 Allergic rhinitis due to pollen: Secondary | ICD-10-CM | POA: Diagnosis not present

## 2021-10-16 DIAGNOSIS — J3081 Allergic rhinitis due to animal (cat) (dog) hair and dander: Secondary | ICD-10-CM | POA: Diagnosis not present

## 2021-10-16 DIAGNOSIS — J301 Allergic rhinitis due to pollen: Secondary | ICD-10-CM | POA: Diagnosis not present

## 2021-10-16 DIAGNOSIS — J3089 Other allergic rhinitis: Secondary | ICD-10-CM | POA: Diagnosis not present

## 2021-10-17 DIAGNOSIS — S6431XA Injury of digital nerve of right thumb, initial encounter: Secondary | ICD-10-CM | POA: Diagnosis not present

## 2021-10-17 DIAGNOSIS — S61011A Laceration without foreign body of right thumb without damage to nail, initial encounter: Secondary | ICD-10-CM | POA: Diagnosis not present

## 2021-10-17 DIAGNOSIS — R208 Other disturbances of skin sensation: Secondary | ICD-10-CM | POA: Diagnosis not present

## 2021-10-17 DIAGNOSIS — M25641 Stiffness of right hand, not elsewhere classified: Secondary | ICD-10-CM | POA: Diagnosis not present

## 2021-10-19 DIAGNOSIS — J301 Allergic rhinitis due to pollen: Secondary | ICD-10-CM | POA: Diagnosis not present

## 2021-10-19 DIAGNOSIS — J3081 Allergic rhinitis due to animal (cat) (dog) hair and dander: Secondary | ICD-10-CM | POA: Diagnosis not present

## 2021-10-19 DIAGNOSIS — J3089 Other allergic rhinitis: Secondary | ICD-10-CM | POA: Diagnosis not present

## 2021-10-23 DIAGNOSIS — J3081 Allergic rhinitis due to animal (cat) (dog) hair and dander: Secondary | ICD-10-CM | POA: Diagnosis not present

## 2021-10-23 DIAGNOSIS — J301 Allergic rhinitis due to pollen: Secondary | ICD-10-CM | POA: Diagnosis not present

## 2021-10-23 DIAGNOSIS — J3089 Other allergic rhinitis: Secondary | ICD-10-CM | POA: Diagnosis not present

## 2021-10-24 DIAGNOSIS — R208 Other disturbances of skin sensation: Secondary | ICD-10-CM | POA: Diagnosis not present

## 2021-10-24 DIAGNOSIS — S61011A Laceration without foreign body of right thumb without damage to nail, initial encounter: Secondary | ICD-10-CM | POA: Diagnosis not present

## 2021-10-24 DIAGNOSIS — R972 Elevated prostate specific antigen [PSA]: Secondary | ICD-10-CM | POA: Diagnosis not present

## 2021-10-24 DIAGNOSIS — M25641 Stiffness of right hand, not elsewhere classified: Secondary | ICD-10-CM | POA: Diagnosis not present

## 2021-10-24 DIAGNOSIS — S6431XA Injury of digital nerve of right thumb, initial encounter: Secondary | ICD-10-CM | POA: Diagnosis not present

## 2021-10-25 DIAGNOSIS — J3089 Other allergic rhinitis: Secondary | ICD-10-CM | POA: Diagnosis not present

## 2021-10-25 DIAGNOSIS — J3081 Allergic rhinitis due to animal (cat) (dog) hair and dander: Secondary | ICD-10-CM | POA: Diagnosis not present

## 2021-10-25 DIAGNOSIS — J301 Allergic rhinitis due to pollen: Secondary | ICD-10-CM | POA: Diagnosis not present

## 2021-10-30 DIAGNOSIS — N201 Calculus of ureter: Secondary | ICD-10-CM | POA: Diagnosis not present

## 2021-10-30 DIAGNOSIS — R972 Elevated prostate specific antigen [PSA]: Secondary | ICD-10-CM | POA: Diagnosis not present

## 2021-10-30 DIAGNOSIS — L723 Sebaceous cyst: Secondary | ICD-10-CM | POA: Diagnosis not present

## 2021-10-31 DIAGNOSIS — S6431XA Injury of digital nerve of right thumb, initial encounter: Secondary | ICD-10-CM | POA: Diagnosis not present

## 2021-10-31 DIAGNOSIS — M25641 Stiffness of right hand, not elsewhere classified: Secondary | ICD-10-CM | POA: Diagnosis not present

## 2021-10-31 DIAGNOSIS — R208 Other disturbances of skin sensation: Secondary | ICD-10-CM | POA: Diagnosis not present

## 2021-10-31 DIAGNOSIS — S61011A Laceration without foreign body of right thumb without damage to nail, initial encounter: Secondary | ICD-10-CM | POA: Diagnosis not present

## 2021-11-01 DIAGNOSIS — J3081 Allergic rhinitis due to animal (cat) (dog) hair and dander: Secondary | ICD-10-CM | POA: Diagnosis not present

## 2021-11-01 DIAGNOSIS — J3089 Other allergic rhinitis: Secondary | ICD-10-CM | POA: Diagnosis not present

## 2021-11-01 DIAGNOSIS — J301 Allergic rhinitis due to pollen: Secondary | ICD-10-CM | POA: Diagnosis not present

## 2021-11-07 ENCOUNTER — Ambulatory Visit (HOSPITAL_BASED_OUTPATIENT_CLINIC_OR_DEPARTMENT_OTHER)
Admission: RE | Admit: 2021-11-07 | Discharge: 2021-11-07 | Disposition: A | Discharge status: Home / Self Care | Payer: BC Managed Care – PPO | Source: Ambulatory Visit | Attending: Internal Medicine | Admitting: Internal Medicine

## 2021-11-07 ENCOUNTER — Encounter (HOSPITAL_BASED_OUTPATIENT_CLINIC_OR_DEPARTMENT_OTHER): Payer: Self-pay

## 2021-11-07 DIAGNOSIS — J301 Allergic rhinitis due to pollen: Secondary | ICD-10-CM | POA: Diagnosis not present

## 2021-11-07 DIAGNOSIS — J3089 Other allergic rhinitis: Secondary | ICD-10-CM | POA: Diagnosis not present

## 2021-11-07 DIAGNOSIS — J3081 Allergic rhinitis due to animal (cat) (dog) hair and dander: Secondary | ICD-10-CM | POA: Diagnosis not present

## 2021-11-07 DIAGNOSIS — I2510 Atherosclerotic heart disease of native coronary artery without angina pectoris: Secondary | ICD-10-CM | POA: Insufficient documentation

## 2021-11-08 DIAGNOSIS — S6431XA Injury of digital nerve of right thumb, initial encounter: Secondary | ICD-10-CM | POA: Diagnosis not present

## 2021-11-08 DIAGNOSIS — R208 Other disturbances of skin sensation: Secondary | ICD-10-CM | POA: Diagnosis not present

## 2021-11-08 DIAGNOSIS — M25641 Stiffness of right hand, not elsewhere classified: Secondary | ICD-10-CM | POA: Diagnosis not present

## 2021-11-08 DIAGNOSIS — S61011A Laceration without foreign body of right thumb without damage to nail, initial encounter: Secondary | ICD-10-CM | POA: Diagnosis not present

## 2021-11-14 DIAGNOSIS — J301 Allergic rhinitis due to pollen: Secondary | ICD-10-CM | POA: Diagnosis not present

## 2021-11-14 DIAGNOSIS — J3081 Allergic rhinitis due to animal (cat) (dog) hair and dander: Secondary | ICD-10-CM | POA: Diagnosis not present

## 2021-11-14 DIAGNOSIS — J3089 Other allergic rhinitis: Secondary | ICD-10-CM | POA: Diagnosis not present

## 2021-11-15 DIAGNOSIS — M25641 Stiffness of right hand, not elsewhere classified: Secondary | ICD-10-CM | POA: Diagnosis not present

## 2021-11-15 DIAGNOSIS — R208 Other disturbances of skin sensation: Secondary | ICD-10-CM | POA: Diagnosis not present

## 2021-11-15 DIAGNOSIS — S61011A Laceration without foreign body of right thumb without damage to nail, initial encounter: Secondary | ICD-10-CM | POA: Diagnosis not present

## 2021-11-15 DIAGNOSIS — S6431XA Injury of digital nerve of right thumb, initial encounter: Secondary | ICD-10-CM | POA: Diagnosis not present

## 2021-11-27 ENCOUNTER — Telehealth (HOSPITAL_COMMUNITY): Payer: Self-pay | Admitting: Cardiology

## 2021-11-27 MED ORDER — ATORVASTATIN CALCIUM 40 MG PO TABS: 40.0000 mg | ORAL_TABLET | Freq: Every day | ORAL | 3 refills | Status: DC

## 2021-11-27 NOTE — Telephone Encounter (Signed)
Ct scoring discussed with patient by Dr Haroldine Laws  Script sent  Meds sent

## 2021-11-27 NOTE — Telephone Encounter (Signed)
-----   Message from Jolaine Artist, MD sent at 11/26/2021 12:37 PM EDT ----- I discussed with him. Increase atorva to 40 daily

## 2021-11-28 DIAGNOSIS — M25522 Pain in left elbow: Secondary | ICD-10-CM | POA: Diagnosis not present

## 2021-11-28 DIAGNOSIS — M79602 Pain in left arm: Secondary | ICD-10-CM | POA: Diagnosis not present

## 2021-11-29 DIAGNOSIS — R208 Other disturbances of skin sensation: Secondary | ICD-10-CM | POA: Diagnosis not present

## 2021-11-29 DIAGNOSIS — M79602 Pain in left arm: Secondary | ICD-10-CM | POA: Diagnosis not present

## 2021-11-29 DIAGNOSIS — M25641 Stiffness of right hand, not elsewhere classified: Secondary | ICD-10-CM | POA: Diagnosis not present

## 2021-11-29 DIAGNOSIS — S6431XA Injury of digital nerve of right thumb, initial encounter: Secondary | ICD-10-CM | POA: Diagnosis not present

## 2021-11-29 DIAGNOSIS — S61011A Laceration without foreign body of right thumb without damage to nail, initial encounter: Secondary | ICD-10-CM | POA: Diagnosis not present

## 2021-11-30 DIAGNOSIS — S46292A Other injury of muscle, fascia and tendon of other parts of biceps, left arm, initial encounter: Secondary | ICD-10-CM | POA: Diagnosis not present

## 2021-11-30 DIAGNOSIS — J3081 Allergic rhinitis due to animal (cat) (dog) hair and dander: Secondary | ICD-10-CM | POA: Diagnosis not present

## 2021-11-30 DIAGNOSIS — M25522 Pain in left elbow: Secondary | ICD-10-CM | POA: Diagnosis not present

## 2021-11-30 DIAGNOSIS — J3089 Other allergic rhinitis: Secondary | ICD-10-CM | POA: Diagnosis not present

## 2021-11-30 DIAGNOSIS — J301 Allergic rhinitis due to pollen: Secondary | ICD-10-CM | POA: Diagnosis not present

## 2021-12-03 DIAGNOSIS — R208 Other disturbances of skin sensation: Secondary | ICD-10-CM | POA: Diagnosis not present

## 2021-12-03 DIAGNOSIS — S61011A Laceration without foreign body of right thumb without damage to nail, initial encounter: Secondary | ICD-10-CM | POA: Diagnosis not present

## 2021-12-03 DIAGNOSIS — M25641 Stiffness of right hand, not elsewhere classified: Secondary | ICD-10-CM | POA: Diagnosis not present

## 2021-12-03 DIAGNOSIS — S6431XA Injury of digital nerve of right thumb, initial encounter: Secondary | ICD-10-CM | POA: Diagnosis not present

## 2021-12-05 DIAGNOSIS — J3089 Other allergic rhinitis: Secondary | ICD-10-CM | POA: Diagnosis not present

## 2021-12-05 DIAGNOSIS — J3081 Allergic rhinitis due to animal (cat) (dog) hair and dander: Secondary | ICD-10-CM | POA: Diagnosis not present

## 2021-12-05 DIAGNOSIS — J301 Allergic rhinitis due to pollen: Secondary | ICD-10-CM | POA: Diagnosis not present

## 2021-12-12 DIAGNOSIS — M25522 Pain in left elbow: Secondary | ICD-10-CM | POA: Diagnosis not present

## 2021-12-13 DIAGNOSIS — S61011A Laceration without foreign body of right thumb without damage to nail, initial encounter: Secondary | ICD-10-CM | POA: Diagnosis not present

## 2021-12-13 DIAGNOSIS — M25641 Stiffness of right hand, not elsewhere classified: Secondary | ICD-10-CM | POA: Diagnosis not present

## 2021-12-13 DIAGNOSIS — R208 Other disturbances of skin sensation: Secondary | ICD-10-CM | POA: Diagnosis not present

## 2021-12-13 DIAGNOSIS — S6431XA Injury of digital nerve of right thumb, initial encounter: Secondary | ICD-10-CM | POA: Diagnosis not present

## 2021-12-14 DIAGNOSIS — J301 Allergic rhinitis due to pollen: Secondary | ICD-10-CM | POA: Diagnosis not present

## 2021-12-14 DIAGNOSIS — J3081 Allergic rhinitis due to animal (cat) (dog) hair and dander: Secondary | ICD-10-CM | POA: Diagnosis not present

## 2021-12-14 DIAGNOSIS — J3089 Other allergic rhinitis: Secondary | ICD-10-CM | POA: Diagnosis not present

## 2021-12-18 DIAGNOSIS — S61011A Laceration without foreign body of right thumb without damage to nail, initial encounter: Secondary | ICD-10-CM | POA: Diagnosis not present

## 2021-12-18 DIAGNOSIS — S6431XA Injury of digital nerve of right thumb, initial encounter: Secondary | ICD-10-CM | POA: Diagnosis not present

## 2021-12-20 DIAGNOSIS — L723 Sebaceous cyst: Secondary | ICD-10-CM | POA: Diagnosis not present

## 2021-12-21 DIAGNOSIS — J3089 Other allergic rhinitis: Secondary | ICD-10-CM | POA: Diagnosis not present

## 2021-12-21 DIAGNOSIS — J301 Allergic rhinitis due to pollen: Secondary | ICD-10-CM | POA: Diagnosis not present

## 2021-12-21 DIAGNOSIS — J3081 Allergic rhinitis due to animal (cat) (dog) hair and dander: Secondary | ICD-10-CM | POA: Diagnosis not present

## 2021-12-25 DIAGNOSIS — S46292A Other injury of muscle, fascia and tendon of other parts of biceps, left arm, initial encounter: Secondary | ICD-10-CM | POA: Diagnosis not present

## 2021-12-26 DIAGNOSIS — J3089 Other allergic rhinitis: Secondary | ICD-10-CM | POA: Diagnosis not present

## 2021-12-26 DIAGNOSIS — J3081 Allergic rhinitis due to animal (cat) (dog) hair and dander: Secondary | ICD-10-CM | POA: Diagnosis not present

## 2021-12-26 DIAGNOSIS — J301 Allergic rhinitis due to pollen: Secondary | ICD-10-CM | POA: Diagnosis not present

## 2021-12-28 DIAGNOSIS — S61011A Laceration without foreign body of right thumb without damage to nail, initial encounter: Secondary | ICD-10-CM | POA: Diagnosis not present

## 2021-12-28 DIAGNOSIS — R208 Other disturbances of skin sensation: Secondary | ICD-10-CM | POA: Diagnosis not present

## 2021-12-28 DIAGNOSIS — M25641 Stiffness of right hand, not elsewhere classified: Secondary | ICD-10-CM | POA: Diagnosis not present

## 2021-12-28 DIAGNOSIS — S6431XA Injury of digital nerve of right thumb, initial encounter: Secondary | ICD-10-CM | POA: Diagnosis not present

## 2022-01-03 DIAGNOSIS — J3081 Allergic rhinitis due to animal (cat) (dog) hair and dander: Secondary | ICD-10-CM | POA: Diagnosis not present

## 2022-01-03 DIAGNOSIS — J301 Allergic rhinitis due to pollen: Secondary | ICD-10-CM | POA: Diagnosis not present

## 2022-01-03 DIAGNOSIS — J3089 Other allergic rhinitis: Secondary | ICD-10-CM | POA: Diagnosis not present

## 2022-01-11 DIAGNOSIS — J3089 Other allergic rhinitis: Secondary | ICD-10-CM | POA: Diagnosis not present

## 2022-01-11 DIAGNOSIS — J3081 Allergic rhinitis due to animal (cat) (dog) hair and dander: Secondary | ICD-10-CM | POA: Diagnosis not present

## 2022-01-11 DIAGNOSIS — J301 Allergic rhinitis due to pollen: Secondary | ICD-10-CM | POA: Diagnosis not present

## 2022-01-24 DIAGNOSIS — J301 Allergic rhinitis due to pollen: Secondary | ICD-10-CM | POA: Diagnosis not present

## 2022-01-24 DIAGNOSIS — J3089 Other allergic rhinitis: Secondary | ICD-10-CM | POA: Diagnosis not present

## 2022-01-24 DIAGNOSIS — J3081 Allergic rhinitis due to animal (cat) (dog) hair and dander: Secondary | ICD-10-CM | POA: Diagnosis not present

## 2022-01-25 DIAGNOSIS — S46292A Other injury of muscle, fascia and tendon of other parts of biceps, left arm, initial encounter: Secondary | ICD-10-CM | POA: Diagnosis not present

## 2022-01-25 DIAGNOSIS — M25522 Pain in left elbow: Secondary | ICD-10-CM | POA: Diagnosis not present

## 2022-01-31 DIAGNOSIS — J301 Allergic rhinitis due to pollen: Secondary | ICD-10-CM | POA: Diagnosis not present

## 2022-01-31 DIAGNOSIS — J3081 Allergic rhinitis due to animal (cat) (dog) hair and dander: Secondary | ICD-10-CM | POA: Diagnosis not present

## 2022-01-31 DIAGNOSIS — J3089 Other allergic rhinitis: Secondary | ICD-10-CM | POA: Diagnosis not present

## 2022-02-01 ENCOUNTER — Ambulatory Visit (INDEPENDENT_AMBULATORY_CARE_PROVIDER_SITE_OTHER): Payer: BC Managed Care – PPO | Admitting: Gastroenterology

## 2022-02-01 ENCOUNTER — Encounter: Payer: Self-pay | Admitting: Gastroenterology

## 2022-02-01 VITALS — BP 118/76 | HR 80 | Ht 67.5 in | Wt 193.0 lb

## 2022-02-01 DIAGNOSIS — K5730 Diverticulosis of large intestine without perforation or abscess without bleeding: Secondary | ICD-10-CM | POA: Diagnosis not present

## 2022-02-01 DIAGNOSIS — K649 Unspecified hemorrhoids: Secondary | ICD-10-CM

## 2022-02-01 MED ORDER — NA SULFATE-K SULFATE-MG SULF 17.5-3.13-1.6 GM/177ML PO SOLN: 1.0000 | Freq: Once | ORAL | 0 refills | Status: AC

## 2022-02-01 NOTE — Progress Notes (Signed)
Referring Provider: Cleatis Polka., MD Primary Care Physician:  Cleatis Polka., MD   Reason for Consultation: Colon cancer screening   IMPRESSION:  Colon cancer screening.  Last colonoscopy in 2014 with Dr. Maisie Fus. Surveillance colonoscopy recommended.  Internal and external hemorrhoids with history of rectal bleeding. Prior hemorrhoidectomy after unsuccessful banding. No further bleeding.  Diverticulosis by CT. Continue daily fiber supplements. High fiber diet,. No need to avoid seeds, corn, berries, and nuts.   BMI 30. Desire to lose weight.    PLAN: - Continue daily fiber supplements, dietary recommendations per patient instructions - Colonoscopy for colon cancer screening - Referral to health weight and wellness   HPI: Jesse Horne is a 61 y.o. male referred by Dr. Clelia Croft for colon cancer screening.  The history is obtained through the patient and review of his electronic health record.  He has a history of hypertension, hyperlipidemia, elevated PSA, allergic rhinitis, and nephrolithiasis. He is a Firefighter.  Colonoscopy for rectal bleeding 07/29/2012 with Dr. Romie Levee that was normal except for internal and external hemorrhoids. He had banding but didn't find that helpful. He ultimately had hemorrhoidectomy 2015. No further significant bleeding.   He had a colonoscopy 10 years prior to that which was normal per his report.  He uses daily psyllium since that time.  GI review of systems is now largely negative.  A renal protocol CT in March 2023 showed colonic diverticulosis and a small hiatal hernia  Labs in March 2023 showed no anemia  There is no known family history of colon cancer or polyps. No family history of stomach cancer or other GI malignancy. No family history of inflammatory bowel disease or celiac.   He has recently been frustrated by the inability to lose weight.  He started intermittent fasting but is looking for more formal  guidance.   Past Medical History:  Diagnosis Date   At risk for sleep apnea    STOP-BANG= 4    SENT TO PCP 12-24-2013   Family history of anesthesia complication    SISTER--  PONV   Hyperlipidemia    Hypertension    Internal hemorrhoid, bleeding    Kidney stones    Seasonal and perennial allergic rhinitis    Wears glasses     Past Surgical History:  Procedure Laterality Date   COLONOSCOPY N/A 07/29/2012   Procedure: COLONOSCOPY;  Surgeon: Romie Levee, MD;  Location: [facility];  Service: Endoscopy;  Laterality: N/A;    EXTRACORPOREAL SHOCK WAVE LITHOTRIPSY Left 05/28/2021   Procedure: LEFT EXTRACORPOREAL SHOCK WAVE LITHOTRIPSY (ESWL);  Surgeon: Sebastian Ache, MD;  Location: [facility];  Service: Urology;  Laterality: Left;   TRANSANAL HEMORRHOIDAL DEARTERIALIZATION N/A 12/29/2013   Procedure: TRANSANAL HEMORRHOIDAL DEARTERIALIZATION;  Surgeon: Romie Levee, MD;  Location: [facility];  Service: General;  Laterality: N/A;   TRANSTHORACIC ECHOCARDIOGRAM  03-17-2008   mild focal septal hypertrophy/  ef 60%/  trivial TR    Current Outpatient Medications  Medication Sig Dispense Refill   atorvastatin (LIPITOR) 40 MG tablet Take 1 tablet (40 mg total) by mouth daily. 90 tablet 3   azelastine (ASTELIN) 0.1 % nasal spray Place 1 spray into both nostrils 2 (two) times daily.  3   DYMISTA 137-50 MCG/ACT SUSP SPRAY 1 SPRAY INTO EACH NOSTRIL TWICE A DAY  3   EPIPEN 2-PAK 0.3 MG/0.3ML SOAJ Inject 0.3 mg into the muscle as needed.     ibuprofen (ADVIL) 200 MG tablet  Take 200 mg by mouth every 6 (six) hours as needed.     levocetirizine (XYZAL) 5 MG tablet Take 5 mg by mouth every morning.      mometasone (NASONEX) 50 MCG/ACT nasal spray Place 2 sprays into the nose as needed.     Psyllium-Calcium (METAMUCIL PLUS CALCIUM) CAPS Take 8 capsules by mouth daily.     sildenafil (VIAGRA) 100 MG tablet Take 100 mg by mouth as needed for erectile dysfunction.      valsartan-hydrochlorothiazide (DIOVAN-HCT) 160-12.5 MG tablet TAKE 1 TABLET BY MOUTH DAILY. 90 tablet 1   vitamin C (ASCORBIC ACID) 500 MG tablet Take 500 mg by mouth daily.     vitamin E 400 UNIT capsule Take 400 Units by mouth daily.     No current facility-administered medications for this visit.    Allergies as of 02/01/2022   (No Known Allergies)    Family History  Problem Relation Age of Onset   Cancer Mother        Melanoma, Basal Cell Carcinoma, Breast   Heart disease Father    Cancer Sister        Breast   Heart disease Sister      Review of Systems: 12 system ROS is negative except as noted above with the addition of allergies and back pain.   Physical Exam: General:   Alert,  well-nourished, pleasant and cooperative in NAD Head:  Normocephalic and atraumatic. Eyes:  Sclera clear, no icterus.   Conjunctiva pink. Ears:  Normal auditory acuity. Nose:  No deformity, discharge,  or lesions. Mouth:  No deformity or lesions.   Neck:  Supple; no masses or thyromegaly. Lungs:  Clear throughout to auscultation.   No wheezes. Heart:  Regular rate and rhythm; no murmurs. Abdomen:  Soft, nontender, nondistended, normal bowel sounds, no rebound or guarding. No hepatosplenomegaly.   Rectal:  Deferred  Msk:  Symmetrical. No boney deformities LAD: No inguinal or umbilical LAD Extremities:  No clubbing or edema. Neurologic:  Alert and  oriented x4;  grossly nonfocal Skin:  Intact without significant lesions or rashes. Psych:  Alert and cooperative. Normal mood and affect.    Miller Limehouse L. Orvan Falconer, MD, MPH 02/01/2022, 9:14 AM

## 2022-02-01 NOTE — Patient Instructions (Signed)
It was a pleasure to meet you today.  Follow a high fiber diet and continue to use your daily fiber supplements.  Your CT in August showed diverticulosis. There is no need to avoid seeds, nuts, corn, and berries.   We discussed a referral to healthy weight and wellness.  Tips for colonoscopy:  - Stay well hydrated for 3-4 days prior to the exam. This reduces nausea and dehydration.  - To prevent skin/hemorrhoid irritation - prior to wiping, put A&Dointment or vaseline on the toilet paper. - Keep a towel or pad on the bed.  - Drink  64oz of clear liquids in the morning of prep day (prior to starting the prep) to be sure that there is enough fluid to flush the colon and stay hydrated!!!! This is in addition to the fluids required for preparation. - Use of a flavored hard candy, such as grape Rubin Payor, can counteract some of the flavor of the prep and may prevent some nausea.

## 2022-02-05 ENCOUNTER — Ambulatory Visit (AMBULATORY_SURGERY_CENTER): Payer: BC Managed Care – PPO | Admitting: Gastroenterology

## 2022-02-05 ENCOUNTER — Encounter: Payer: Self-pay | Admitting: Gastroenterology

## 2022-02-05 VITALS — BP 136/91 | HR 73 | Temp 96.6°F | Resp 18 | Ht 67.0 in | Wt 193.0 lb

## 2022-02-05 DIAGNOSIS — Z1211 Encounter for screening for malignant neoplasm of colon: Secondary | ICD-10-CM | POA: Diagnosis not present

## 2022-02-05 DIAGNOSIS — K649 Unspecified hemorrhoids: Secondary | ICD-10-CM

## 2022-02-05 DIAGNOSIS — K5730 Diverticulosis of large intestine without perforation or abscess without bleeding: Secondary | ICD-10-CM

## 2022-02-05 MED ORDER — SODIUM CHLORIDE 0.9 % IV SOLN: 500.0000 mL | Freq: Once | INTRAVENOUS | Status: DC

## 2022-02-05 NOTE — Progress Notes (Signed)
VS completed by DT.  Pt's states no medical or surgical changes since previsit or office visit.  

## 2022-02-05 NOTE — Op Note (Signed)
[facility] Patient Name: Jesse Horne Procedure Date: 02/05/2022 10:09 AM MRN: [id number] Endoscopist: Tressia Danas MD, MD, [id number] Age: 61 Referring MD:  Date of Birth: [birth year] Gender: Male Account #: [id number] Procedure:                Colonoscopy Indications:              Screening for colorectal malignant neoplasm                           Colonoscopy for rectal bleeding 07/29/2012 with Dr.                            Romie Levee that was normal except for internal                            and external hemorrhoids. He had banding but didn't                            find that helpful. He ultimately had                            hemorrhoidectomy 2015.                           He had a colonoscopy 10 years prior to that which                            was normal per his report. Medicines:                Monitored Anesthesia Care Procedure:                Pre-Anesthesia Assessment:                           - Prior to the procedure, a History and Physical                            was performed, and patient medications and                            allergies were reviewed. The patient's tolerance of                            previous anesthesia was also reviewed. The risks                            and benefits of the procedure and the sedation                            options and risks were discussed with the patient.                            All questions were answered, and informed consent  was obtained. Prior Anticoagulants: The patient has                            taken no anticoagulant or antiplatelet agents. ASA                            Grade Assessment: II - A patient with mild systemic                            disease. After reviewing the risks and benefits,                            the patient was deemed in satisfactory condition to                            undergo the procedure.                            After obtaining informed consent, the colonoscope                            was passed under direct vision. Throughout the                            procedure, the patient's blood pressure, pulse, and                            oxygen saturations were monitored continuously. The                            Olympus CF-HQ190L (Serial# [id number]) Colonoscope was                            introduced through the anus and advanced to the 3                            cm into the ileum. A second forward view of the                            right colon was performed. The colonoscopy was                            performed without difficulty. The patient tolerated                            the procedure well. The quality of the bowel                            preparation was good. The terminal ileum, ileocecal                            valve, appendiceal orifice, and rectum were  photographed. Scope In: 10:14:43 AM Scope Out: 10:31:31 AM Scope Withdrawal Time: 0 hours 13 minutes 26 seconds  Total Procedure Duration: 0 hours 16 minutes 48 seconds  Findings:                 Skin tags were found on perianal exam.                           Non-bleeding external and internal hemorrhoids were                            found.                           Multiple medium-mouthed and small-mouthed                            diverticula were found in the entire colon.                           The exam was otherwise without abnormality on                            direct and retroflexion views. Complications:            No immediate complications. Estimated Blood Loss:     Estimated blood loss: none. Impression:               - Perianal skin tags found on perianal exam.                           - Non-bleeding external and internal hemorrhoids.                           - Diverticulosis in the entire examined colon.                           - The examination was otherwise  normal on direct                            and retroflexion views.                           - No specimens collected. Recommendation:           - Patient has a contact number available for                            emergencies. The signs and symptoms of potential                            delayed complications were discussed with the                            patient. Return to normal activities tomorrow.                            Written discharge instructions were  provided to the                            patient.                           - Follow a high fiber diet. Drink at least 64                            ounces of water daily. Add a daily stool bulking                            agent such as psyllium (an exampled would be                            Metamucil).                           - Continue present medications.                           - Repeat colonoscopy in 10 years for surveillance,                            earlier with new symptoms.                           - Emerging evidence supports eating a diet of                            fruits, vegetables, grains, calcium, and yogurt                            while reducing red meat and alcohol may reduce the                            risk of colon cancer.                           - Thank you for allowing me to be involved in your                            colon cancer prevention. Tressia Danas MD, MD 02/05/2022 10:37:51 AM This report has been signed electronically.

## 2022-02-05 NOTE — Progress Notes (Signed)
Referring Provider: Cleatis Polka., MD Primary Care Physician:  Cleatis Polka., MD  Indication for Colonoscopy:  Colon cancer screening   IMPRESSION:  Need for colon cancer screening Appropriate candidate for monitored anesthesia care  PLAN: Colonoscopy in the [facility] today   HPI: Jesse Horne is a 61 y.o. male presents for screening colonoscopy.  Colonoscopy for rectal bleeding 07/29/2012 with Dr. Romie Levee that was normal except for internal and external hemorrhoids. He had banding but didn't find that helpful. He ultimately had hemorrhoidectomy 2015. No further significant bleeding.    He had a colonoscopy 10 years prior to that which was normal per his report.   He uses daily psyllium since that time.  GI review of systems is now largely negative.   A renal protocol CT in March 2023 showed colonic diverticulosis and a small hiatal hernia   Labs in March 2023 showed no anemia   There is no known family history of colon cancer or polyps. No family history of stomach cancer or other GI malignancy. No family history of inflammatory bowel disease or celiac.    He has recently been frustrated by the inability to lose weight.  He started intermittent fasting but is looking for more formal guidance.   Past Medical History:  Diagnosis Date   At risk for sleep apnea    STOP-BANG= 4    SENT TO PCP 12-24-2013   Family history of anesthesia complication    SISTER--  PONV   Hyperlipidemia    Hypertension    Internal hemorrhoid, bleeding    Kidney stones    Seasonal and perennial allergic rhinitis    Wears glasses     Past Surgical History:  Procedure Laterality Date   COLONOSCOPY N/A 07/29/2012   Procedure: COLONOSCOPY;  Surgeon: Romie Levee, MD;  Location: [facility];  Service: Endoscopy;  Laterality: N/A;    COLONOSCOPY     EXTRACORPOREAL SHOCK WAVE LITHOTRIPSY Left 05/28/2021   Procedure: LEFT EXTRACORPOREAL SHOCK WAVE LITHOTRIPSY (ESWL);  Surgeon: Sebastian Ache, MD;  Location: [facility];  Service: Urology;  Laterality: Left;   TRANSANAL HEMORRHOIDAL DEARTERIALIZATION N/A 12/29/2013   Procedure: TRANSANAL HEMORRHOIDAL DEARTERIALIZATION;  Surgeon: Romie Levee, MD;  Location: [facility];  Service: General;  Laterality: N/A;   TRANSTHORACIC ECHOCARDIOGRAM  03/17/2008   mild focal septal hypertrophy/  ef 60%/  trivial TR    Current Outpatient Medications  Medication Sig Dispense Refill   atorvastatin (LIPITOR) 40 MG tablet Take 1 tablet (40 mg total) by mouth daily. 90 tablet 3   ibuprofen (ADVIL) 200 MG tablet Take 200 mg by mouth every 6 (six) hours as needed.     Psyllium-Calcium (METAMUCIL PLUS CALCIUM) CAPS Take 8 capsules by mouth daily.     valsartan-hydrochlorothiazide (DIOVAN-HCT) 160-12.5 MG tablet TAKE 1 TABLET BY MOUTH DAILY. 90 tablet 1   azelastine (ASTELIN) 0.1 % nasal spray Place 1 spray into both nostrils 2 (two) times daily.  3   DYMISTA 137-50 MCG/ACT SUSP SPRAY 1 SPRAY INTO EACH NOSTRIL TWICE A DAY  3   EPIPEN 2-PAK 0.3 MG/0.3ML SOAJ Inject 0.3 mg into the muscle as needed.     levocetirizine (XYZAL) 5 MG tablet Take 5 mg by mouth every morning.      mometasone (NASONEX) 50 MCG/ACT nasal spray Place 2 sprays into the nose as needed.     sildenafil (VIAGRA) 100 MG tablet Take 100 mg by mouth as needed for erectile dysfunction.  vitamin C (ASCORBIC ACID) 500 MG tablet Take 500 mg by mouth daily.     vitamin E 400 UNIT capsule Take 400 Units by mouth daily.     Current Facility-Administered Medications  Medication Dose Route Frequency Provider Last Rate Last Admin   0.9 %  sodium chloride infusion  500 mL Intravenous Once Tressia Danas, MD        Allergies as of 02/05/2022   (No Known Allergies)    Family History  Problem Relation Age of Onset   Cancer Mother        Melanoma, Basal Cell Carcinoma, Breast   Heart disease Father    Cancer Sister        Breast   Heart disease  Sister    Colon cancer Neg Hx    Stomach cancer Neg Hx    Rectal cancer Neg Hx      Physical Exam: General:   Alert,  well-nourished, pleasant and cooperative in NAD Head:  Normocephalic and atraumatic. Eyes:  Sclera clear, no icterus.   Conjunctiva pink. Mouth:  No deformity or lesions.   Neck:  Supple; no masses or thyromegaly. Lungs:  Clear throughout to auscultation.   No wheezes. Heart:  Regular rate and rhythm; no murmurs. Abdomen:  Soft, non-tender, nondistended, normal bowel sounds, no rebound or guarding.  Msk:  Symmetrical. No boney deformities LAD: No inguinal or umbilical LAD Extremities:  No clubbing or edema. Neurologic:  Alert and  oriented x4;  grossly nonfocal Skin:  No obvious rash or bruise. Psych:  Alert and cooperative. Normal mood and affect.     Studies/Results: No results found.    Agustina Witzke L. Orvan Falconer, MD, MPH 02/05/2022, 10:04 AM

## 2022-02-05 NOTE — Patient Instructions (Signed)
YOU HAD AN ENDOSCOPIC PROCEDURE TODAY AT THE [facility]:   Refer to the procedure report that was given to you for any specific questions about what was found during the examination.  If the procedure report does not answer your questions, please call your gastroenterologist to clarify.  If you requested that your care partner not be given the details of your procedure findings, then the procedure report has been included in a sealed envelope for you to review at your convenience later.  **Handouts given on diverticulosis and hemorrhoids**  YOU SHOULD EXPECT: Some feelings of bloating in the abdomen. Passage of more gas than usual.  Walking can help get rid of the air that was put into your GI tract during the procedure and reduce the bloating. If you had a lower endoscopy (such as a colonoscopy or flexible sigmoidoscopy) you may notice spotting of blood in your stool or on the toilet paper. If you underwent a bowel prep for your procedure, you may not have a normal bowel movement for a few days.  Please Note:  You might notice some irritation and congestion in your nose or some drainage.  This is from the oxygen used during your procedure.  There is no need for concern and it should clear up in a day or so.  SYMPTOMS TO REPORT IMMEDIATELY:  Following lower endoscopy (colonoscopy or flexible sigmoidoscopy):  Excessive amounts of blood in the stool  Significant tenderness or worsening of abdominal pains  Swelling of the abdomen that is new, acute  Fever of 100F or higher  For urgent or emergent issues, a gastroenterologist can be reached at any hour by calling [phone]. Do not use MyChart messaging for urgent concerns.    DIET:  We do recommend a small meal at first, but then you may proceed to your regular diet.  Drink plenty of fluids but you should avoid alcoholic beverages for 24 hours.  ACTIVITY:  You should plan to take it easy for the rest of today and you should NOT  DRIVE or use heavy machinery until tomorrow (because of the sedation medicines used during the test).    FOLLOW UP: Our staff will call the number listed on your records the next business day following your procedure.  We will call around 7:15- 8:00 am to check on you and address any questions or concerns that you may have regarding the information given to you following your procedure. If we do not reach you, we will leave a message.     If any biopsies were taken you will be contacted by phone or by letter within the next 1-3 weeks.  Please call us at [phone] if you have not heard about the biopsies in 3 weeks.    SIGNATURES/CONFIDENTIALITY: You and/or your care partner have signed paperwork which will be entered into your electronic medical record.  These signatures attest to the fact that that the information above on your After Visit Summary has been reviewed and is understood.  Full responsibility of the confidentiality of this discharge information lies with you and/or your care-partner.

## 2022-02-05 NOTE — Progress Notes (Signed)
Pt resting comfortably. VSS. Airway intact. SBAR complete to RN. All questions answered.   

## 2022-02-06 ENCOUNTER — Telehealth: Payer: Self-pay

## 2022-02-06 DIAGNOSIS — R972 Elevated prostate specific antigen [PSA]: Secondary | ICD-10-CM | POA: Diagnosis not present

## 2022-02-06 DIAGNOSIS — T783XXD Angioneurotic edema, subsequent encounter: Secondary | ICD-10-CM | POA: Diagnosis not present

## 2022-02-06 DIAGNOSIS — J3089 Other allergic rhinitis: Secondary | ICD-10-CM | POA: Diagnosis not present

## 2022-02-06 DIAGNOSIS — J3081 Allergic rhinitis due to animal (cat) (dog) hair and dander: Secondary | ICD-10-CM | POA: Diagnosis not present

## 2022-02-06 DIAGNOSIS — I10 Essential (primary) hypertension: Secondary | ICD-10-CM | POA: Diagnosis not present

## 2022-02-06 DIAGNOSIS — J301 Allergic rhinitis due to pollen: Secondary | ICD-10-CM | POA: Diagnosis not present

## 2022-02-06 NOTE — Telephone Encounter (Signed)
  Follow up Call-     02/05/2022    9:09 AM  Call back number  Post procedure Call Back phone  # [phone]  Permission to leave phone message Yes     Patient questions:  Do you have a fever, pain , or abdominal swelling? No. Pain Score  0 *  Have you tolerated food without any problems? Yes.    Have you been able to return to your normal activities? Yes.    Do you have any questions about your discharge instructions: Diet   No. Medications  No. Follow up visit  No.  Do you have questions or concerns about your Care? No.  Actions: * If pain score is 4 or above: No action needed, pain <4.

## 2022-02-07 DIAGNOSIS — J3089 Other allergic rhinitis: Secondary | ICD-10-CM | POA: Diagnosis not present

## 2022-02-07 DIAGNOSIS — J3081 Allergic rhinitis due to animal (cat) (dog) hair and dander: Secondary | ICD-10-CM | POA: Diagnosis not present

## 2022-02-08 DIAGNOSIS — J301 Allergic rhinitis due to pollen: Secondary | ICD-10-CM | POA: Diagnosis not present

## 2022-02-08 DIAGNOSIS — J3081 Allergic rhinitis due to animal (cat) (dog) hair and dander: Secondary | ICD-10-CM | POA: Diagnosis not present

## 2022-02-12 DIAGNOSIS — Z0000 Encounter for general adult medical examination without abnormal findings: Secondary | ICD-10-CM | POA: Diagnosis not present

## 2022-02-12 DIAGNOSIS — Z1339 Encounter for screening examination for other mental health and behavioral disorders: Secondary | ICD-10-CM | POA: Diagnosis not present

## 2022-02-12 DIAGNOSIS — Z23 Encounter for immunization: Secondary | ICD-10-CM | POA: Diagnosis not present

## 2022-02-12 DIAGNOSIS — R82998 Other abnormal findings in urine: Secondary | ICD-10-CM | POA: Diagnosis not present

## 2022-02-12 DIAGNOSIS — I10 Essential (primary) hypertension: Secondary | ICD-10-CM | POA: Diagnosis not present

## 2022-02-12 DIAGNOSIS — Z1331 Encounter for screening for depression: Secondary | ICD-10-CM | POA: Diagnosis not present

## 2022-02-12 DIAGNOSIS — R5383 Other fatigue: Secondary | ICD-10-CM | POA: Diagnosis not present

## 2022-02-12 DIAGNOSIS — J029 Acute pharyngitis, unspecified: Secondary | ICD-10-CM | POA: Diagnosis not present

## 2022-02-12 DIAGNOSIS — R7301 Impaired fasting glucose: Secondary | ICD-10-CM | POA: Diagnosis not present

## 2022-02-28 DIAGNOSIS — S6431XA Injury of digital nerve of right thumb, initial encounter: Secondary | ICD-10-CM | POA: Diagnosis not present

## 2022-02-28 DIAGNOSIS — S61011A Laceration without foreign body of right thumb without damage to nail, initial encounter: Secondary | ICD-10-CM | POA: Diagnosis not present

## 2022-02-28 NOTE — Progress Notes (Signed)
 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  He returns today for final evaluation.  He says he is back to normal activities.  He says he has sensation almost to the tip of the thumb.  He has full function.  He denies any recent trauma.  Examination today reveals a well-developed well-nourished male.  He is alert and oriented x 3.  He walks with a normal gait.  His mood and his affect are normal age-appropriate.  He has full range of motion the cervical spine, shoulders, elbows, forearms, and wrists bilaterally.  His right and left hand exam revealed 2+ radial pulses.  He has brisk cap refill.  Skin is intact with no signs of infection.  He can make a composite fist bilaterally.  Skin turgor is intact.  The thumb has full FPL and EPL function.  He has no excessive sweat pattern and he describes good sensibility to almost the tip of the thumb bilaterally.  Distally on the right left there is no other signs of neurovascular compromise or compression neuropathy.  Impression is a 62 year old male who is 11-month status post digital nerve repair on his thumb and dominant right side.  We recommend continuation of scar massage and normal activities and follow-up here on an as-needed basis.   Electronically signed by: Sissy Donnice Cornet, MD 02/28/22 1352

## 2022-03-01 DIAGNOSIS — J301 Allergic rhinitis due to pollen: Secondary | ICD-10-CM | POA: Diagnosis not present

## 2022-03-01 DIAGNOSIS — J3089 Other allergic rhinitis: Secondary | ICD-10-CM | POA: Diagnosis not present

## 2022-03-01 DIAGNOSIS — J3081 Allergic rhinitis due to animal (cat) (dog) hair and dander: Secondary | ICD-10-CM | POA: Diagnosis not present

## 2022-03-06 DIAGNOSIS — J301 Allergic rhinitis due to pollen: Secondary | ICD-10-CM | POA: Diagnosis not present

## 2022-03-06 DIAGNOSIS — J3081 Allergic rhinitis due to animal (cat) (dog) hair and dander: Secondary | ICD-10-CM | POA: Diagnosis not present

## 2022-03-06 DIAGNOSIS — J3089 Other allergic rhinitis: Secondary | ICD-10-CM | POA: Diagnosis not present

## 2022-03-13 DIAGNOSIS — J069 Acute upper respiratory infection, unspecified: Secondary | ICD-10-CM | POA: Diagnosis not present

## 2022-03-13 DIAGNOSIS — J029 Acute pharyngitis, unspecified: Secondary | ICD-10-CM | POA: Diagnosis not present

## 2022-03-13 DIAGNOSIS — Z1152 Encounter for screening for COVID-19: Secondary | ICD-10-CM | POA: Diagnosis not present

## 2022-03-14 DIAGNOSIS — J301 Allergic rhinitis due to pollen: Secondary | ICD-10-CM | POA: Diagnosis not present

## 2022-03-14 DIAGNOSIS — J3089 Other allergic rhinitis: Secondary | ICD-10-CM | POA: Diagnosis not present

## 2022-03-14 DIAGNOSIS — J3081 Allergic rhinitis due to animal (cat) (dog) hair and dander: Secondary | ICD-10-CM | POA: Diagnosis not present

## 2022-03-20 DIAGNOSIS — J3089 Other allergic rhinitis: Secondary | ICD-10-CM | POA: Diagnosis not present

## 2022-03-20 DIAGNOSIS — J301 Allergic rhinitis due to pollen: Secondary | ICD-10-CM | POA: Diagnosis not present

## 2022-03-20 DIAGNOSIS — J3081 Allergic rhinitis due to animal (cat) (dog) hair and dander: Secondary | ICD-10-CM | POA: Diagnosis not present

## 2022-03-22 DIAGNOSIS — S46292A Other injury of muscle, fascia and tendon of other parts of biceps, left arm, initial encounter: Secondary | ICD-10-CM | POA: Diagnosis not present

## 2022-03-26 DIAGNOSIS — J301 Allergic rhinitis due to pollen: Secondary | ICD-10-CM | POA: Diagnosis not present

## 2022-03-26 DIAGNOSIS — J3081 Allergic rhinitis due to animal (cat) (dog) hair and dander: Secondary | ICD-10-CM | POA: Diagnosis not present

## 2022-03-26 DIAGNOSIS — J3089 Other allergic rhinitis: Secondary | ICD-10-CM | POA: Diagnosis not present

## 2022-04-02 DIAGNOSIS — J3089 Other allergic rhinitis: Secondary | ICD-10-CM | POA: Diagnosis not present

## 2022-04-02 DIAGNOSIS — J301 Allergic rhinitis due to pollen: Secondary | ICD-10-CM | POA: Diagnosis not present

## 2022-04-02 DIAGNOSIS — J3081 Allergic rhinitis due to animal (cat) (dog) hair and dander: Secondary | ICD-10-CM | POA: Diagnosis not present

## 2022-04-09 DIAGNOSIS — J3089 Other allergic rhinitis: Secondary | ICD-10-CM | POA: Diagnosis not present

## 2022-04-09 DIAGNOSIS — J3081 Allergic rhinitis due to animal (cat) (dog) hair and dander: Secondary | ICD-10-CM | POA: Diagnosis not present

## 2022-04-09 DIAGNOSIS — J301 Allergic rhinitis due to pollen: Secondary | ICD-10-CM | POA: Diagnosis not present

## 2022-04-16 DIAGNOSIS — J301 Allergic rhinitis due to pollen: Secondary | ICD-10-CM | POA: Diagnosis not present

## 2022-04-16 DIAGNOSIS — J3089 Other allergic rhinitis: Secondary | ICD-10-CM | POA: Diagnosis not present

## 2022-04-16 DIAGNOSIS — J3081 Allergic rhinitis due to animal (cat) (dog) hair and dander: Secondary | ICD-10-CM | POA: Diagnosis not present

## 2022-04-24 DIAGNOSIS — J301 Allergic rhinitis due to pollen: Secondary | ICD-10-CM | POA: Diagnosis not present

## 2022-04-24 DIAGNOSIS — J3081 Allergic rhinitis due to animal (cat) (dog) hair and dander: Secondary | ICD-10-CM | POA: Diagnosis not present

## 2022-04-24 DIAGNOSIS — J3089 Other allergic rhinitis: Secondary | ICD-10-CM | POA: Diagnosis not present

## 2022-04-30 DIAGNOSIS — J301 Allergic rhinitis due to pollen: Secondary | ICD-10-CM | POA: Diagnosis not present

## 2022-04-30 DIAGNOSIS — J3089 Other allergic rhinitis: Secondary | ICD-10-CM | POA: Diagnosis not present

## 2022-04-30 DIAGNOSIS — J3081 Allergic rhinitis due to animal (cat) (dog) hair and dander: Secondary | ICD-10-CM | POA: Diagnosis not present

## 2022-05-07 DIAGNOSIS — J301 Allergic rhinitis due to pollen: Secondary | ICD-10-CM | POA: Diagnosis not present

## 2022-05-07 DIAGNOSIS — J3081 Allergic rhinitis due to animal (cat) (dog) hair and dander: Secondary | ICD-10-CM | POA: Diagnosis not present

## 2022-05-07 DIAGNOSIS — J3089 Other allergic rhinitis: Secondary | ICD-10-CM | POA: Diagnosis not present

## 2022-05-14 DIAGNOSIS — J301 Allergic rhinitis due to pollen: Secondary | ICD-10-CM | POA: Diagnosis not present

## 2022-05-14 DIAGNOSIS — J3089 Other allergic rhinitis: Secondary | ICD-10-CM | POA: Diagnosis not present

## 2022-05-14 DIAGNOSIS — J3081 Allergic rhinitis due to animal (cat) (dog) hair and dander: Secondary | ICD-10-CM | POA: Diagnosis not present

## 2022-05-29 DIAGNOSIS — J3081 Allergic rhinitis due to animal (cat) (dog) hair and dander: Secondary | ICD-10-CM | POA: Diagnosis not present

## 2022-05-29 DIAGNOSIS — J301 Allergic rhinitis due to pollen: Secondary | ICD-10-CM | POA: Diagnosis not present

## 2022-05-29 DIAGNOSIS — J3089 Other allergic rhinitis: Secondary | ICD-10-CM | POA: Diagnosis not present

## 2022-06-07 DIAGNOSIS — J3081 Allergic rhinitis due to animal (cat) (dog) hair and dander: Secondary | ICD-10-CM | POA: Diagnosis not present

## 2022-06-07 DIAGNOSIS — J3089 Other allergic rhinitis: Secondary | ICD-10-CM | POA: Diagnosis not present

## 2022-06-07 DIAGNOSIS — J301 Allergic rhinitis due to pollen: Secondary | ICD-10-CM | POA: Diagnosis not present

## 2022-06-13 DIAGNOSIS — J301 Allergic rhinitis due to pollen: Secondary | ICD-10-CM | POA: Diagnosis not present

## 2022-06-13 DIAGNOSIS — J3089 Other allergic rhinitis: Secondary | ICD-10-CM | POA: Diagnosis not present

## 2022-06-13 DIAGNOSIS — J3081 Allergic rhinitis due to animal (cat) (dog) hair and dander: Secondary | ICD-10-CM | POA: Diagnosis not present

## 2022-06-19 DIAGNOSIS — J3089 Other allergic rhinitis: Secondary | ICD-10-CM | POA: Diagnosis not present

## 2022-06-19 DIAGNOSIS — J3081 Allergic rhinitis due to animal (cat) (dog) hair and dander: Secondary | ICD-10-CM | POA: Diagnosis not present

## 2022-06-19 DIAGNOSIS — J301 Allergic rhinitis due to pollen: Secondary | ICD-10-CM | POA: Diagnosis not present

## 2022-06-27 DIAGNOSIS — J3081 Allergic rhinitis due to animal (cat) (dog) hair and dander: Secondary | ICD-10-CM | POA: Diagnosis not present

## 2022-06-27 DIAGNOSIS — J3089 Other allergic rhinitis: Secondary | ICD-10-CM | POA: Diagnosis not present

## 2022-06-27 DIAGNOSIS — J301 Allergic rhinitis due to pollen: Secondary | ICD-10-CM | POA: Diagnosis not present

## 2022-07-03 DIAGNOSIS — J301 Allergic rhinitis due to pollen: Secondary | ICD-10-CM | POA: Diagnosis not present

## 2022-07-03 DIAGNOSIS — J3081 Allergic rhinitis due to animal (cat) (dog) hair and dander: Secondary | ICD-10-CM | POA: Diagnosis not present

## 2022-07-03 DIAGNOSIS — J3089 Other allergic rhinitis: Secondary | ICD-10-CM | POA: Diagnosis not present

## 2022-07-09 DIAGNOSIS — J301 Allergic rhinitis due to pollen: Secondary | ICD-10-CM | POA: Diagnosis not present

## 2022-07-09 DIAGNOSIS — J3081 Allergic rhinitis due to animal (cat) (dog) hair and dander: Secondary | ICD-10-CM | POA: Diagnosis not present

## 2022-07-10 DIAGNOSIS — J3081 Allergic rhinitis due to animal (cat) (dog) hair and dander: Secondary | ICD-10-CM | POA: Diagnosis not present

## 2022-07-10 DIAGNOSIS — J3089 Other allergic rhinitis: Secondary | ICD-10-CM | POA: Diagnosis not present

## 2022-07-12 DIAGNOSIS — J3089 Other allergic rhinitis: Secondary | ICD-10-CM | POA: Diagnosis not present

## 2022-07-12 DIAGNOSIS — J3081 Allergic rhinitis due to animal (cat) (dog) hair and dander: Secondary | ICD-10-CM | POA: Diagnosis not present

## 2022-07-12 DIAGNOSIS — J301 Allergic rhinitis due to pollen: Secondary | ICD-10-CM | POA: Diagnosis not present

## 2022-07-16 DIAGNOSIS — J3089 Other allergic rhinitis: Secondary | ICD-10-CM | POA: Diagnosis not present

## 2022-07-16 DIAGNOSIS — J3081 Allergic rhinitis due to animal (cat) (dog) hair and dander: Secondary | ICD-10-CM | POA: Diagnosis not present

## 2022-07-16 DIAGNOSIS — J301 Allergic rhinitis due to pollen: Secondary | ICD-10-CM | POA: Diagnosis not present

## 2022-07-26 DIAGNOSIS — J3081 Allergic rhinitis due to animal (cat) (dog) hair and dander: Secondary | ICD-10-CM | POA: Diagnosis not present

## 2022-07-26 DIAGNOSIS — J301 Allergic rhinitis due to pollen: Secondary | ICD-10-CM | POA: Diagnosis not present

## 2022-07-26 DIAGNOSIS — J3089 Other allergic rhinitis: Secondary | ICD-10-CM | POA: Diagnosis not present

## 2022-07-30 DIAGNOSIS — J3081 Allergic rhinitis due to animal (cat) (dog) hair and dander: Secondary | ICD-10-CM | POA: Diagnosis not present

## 2022-07-30 DIAGNOSIS — J301 Allergic rhinitis due to pollen: Secondary | ICD-10-CM | POA: Diagnosis not present

## 2022-07-30 DIAGNOSIS — J3089 Other allergic rhinitis: Secondary | ICD-10-CM | POA: Diagnosis not present

## 2022-08-07 DIAGNOSIS — J3089 Other allergic rhinitis: Secondary | ICD-10-CM | POA: Diagnosis not present

## 2022-08-07 DIAGNOSIS — M25561 Pain in right knee: Secondary | ICD-10-CM | POA: Diagnosis not present

## 2022-08-07 DIAGNOSIS — J301 Allergic rhinitis due to pollen: Secondary | ICD-10-CM | POA: Diagnosis not present

## 2022-08-07 DIAGNOSIS — M25562 Pain in left knee: Secondary | ICD-10-CM | POA: Diagnosis not present

## 2022-08-07 DIAGNOSIS — J3081 Allergic rhinitis due to animal (cat) (dog) hair and dander: Secondary | ICD-10-CM | POA: Diagnosis not present

## 2022-08-13 DIAGNOSIS — J3081 Allergic rhinitis due to animal (cat) (dog) hair and dander: Secondary | ICD-10-CM | POA: Diagnosis not present

## 2022-08-13 DIAGNOSIS — J3089 Other allergic rhinitis: Secondary | ICD-10-CM | POA: Diagnosis not present

## 2022-08-13 DIAGNOSIS — J301 Allergic rhinitis due to pollen: Secondary | ICD-10-CM | POA: Diagnosis not present

## 2022-09-05 DIAGNOSIS — J3081 Allergic rhinitis due to animal (cat) (dog) hair and dander: Secondary | ICD-10-CM | POA: Diagnosis not present

## 2022-09-05 DIAGNOSIS — J3089 Other allergic rhinitis: Secondary | ICD-10-CM | POA: Diagnosis not present

## 2022-09-05 DIAGNOSIS — J301 Allergic rhinitis due to pollen: Secondary | ICD-10-CM | POA: Diagnosis not present

## 2022-09-10 DIAGNOSIS — J301 Allergic rhinitis due to pollen: Secondary | ICD-10-CM | POA: Diagnosis not present

## 2022-09-10 DIAGNOSIS — J3089 Other allergic rhinitis: Secondary | ICD-10-CM | POA: Diagnosis not present

## 2022-09-10 DIAGNOSIS — J3081 Allergic rhinitis due to animal (cat) (dog) hair and dander: Secondary | ICD-10-CM | POA: Diagnosis not present

## 2022-09-11 DIAGNOSIS — H524 Presbyopia: Secondary | ICD-10-CM | POA: Diagnosis not present

## 2022-09-11 DIAGNOSIS — H40013 Open angle with borderline findings, low risk, bilateral: Secondary | ICD-10-CM | POA: Diagnosis not present

## 2022-09-17 DIAGNOSIS — J301 Allergic rhinitis due to pollen: Secondary | ICD-10-CM | POA: Diagnosis not present

## 2022-09-17 DIAGNOSIS — J3081 Allergic rhinitis due to animal (cat) (dog) hair and dander: Secondary | ICD-10-CM | POA: Diagnosis not present

## 2022-09-17 DIAGNOSIS — J3089 Other allergic rhinitis: Secondary | ICD-10-CM | POA: Diagnosis not present

## 2022-09-24 DIAGNOSIS — J3089 Other allergic rhinitis: Secondary | ICD-10-CM | POA: Diagnosis not present

## 2022-09-24 DIAGNOSIS — J3081 Allergic rhinitis due to animal (cat) (dog) hair and dander: Secondary | ICD-10-CM | POA: Diagnosis not present

## 2022-09-24 DIAGNOSIS — J301 Allergic rhinitis due to pollen: Secondary | ICD-10-CM | POA: Diagnosis not present

## 2022-10-01 DIAGNOSIS — J3081 Allergic rhinitis due to animal (cat) (dog) hair and dander: Secondary | ICD-10-CM | POA: Diagnosis not present

## 2022-10-01 DIAGNOSIS — J301 Allergic rhinitis due to pollen: Secondary | ICD-10-CM | POA: Diagnosis not present

## 2022-10-01 DIAGNOSIS — J3089 Other allergic rhinitis: Secondary | ICD-10-CM | POA: Diagnosis not present

## 2022-10-10 DIAGNOSIS — J3089 Other allergic rhinitis: Secondary | ICD-10-CM | POA: Diagnosis not present

## 2022-10-10 DIAGNOSIS — J301 Allergic rhinitis due to pollen: Secondary | ICD-10-CM | POA: Diagnosis not present

## 2022-10-10 DIAGNOSIS — J3081 Allergic rhinitis due to animal (cat) (dog) hair and dander: Secondary | ICD-10-CM | POA: Diagnosis not present

## 2022-10-15 DIAGNOSIS — J301 Allergic rhinitis due to pollen: Secondary | ICD-10-CM | POA: Diagnosis not present

## 2022-10-15 DIAGNOSIS — J3089 Other allergic rhinitis: Secondary | ICD-10-CM | POA: Diagnosis not present

## 2022-10-15 DIAGNOSIS — J3081 Allergic rhinitis due to animal (cat) (dog) hair and dander: Secondary | ICD-10-CM | POA: Diagnosis not present

## 2022-10-23 DIAGNOSIS — I10 Essential (primary) hypertension: Secondary | ICD-10-CM | POA: Diagnosis not present

## 2022-10-25 DIAGNOSIS — J3089 Other allergic rhinitis: Secondary | ICD-10-CM | POA: Diagnosis not present

## 2022-10-25 DIAGNOSIS — J3081 Allergic rhinitis due to animal (cat) (dog) hair and dander: Secondary | ICD-10-CM | POA: Diagnosis not present

## 2022-10-25 DIAGNOSIS — J301 Allergic rhinitis due to pollen: Secondary | ICD-10-CM | POA: Diagnosis not present

## 2022-11-02 DIAGNOSIS — Z20822 Contact with and (suspected) exposure to covid-19: Secondary | ICD-10-CM | POA: Diagnosis not present

## 2022-11-02 DIAGNOSIS — R5383 Other fatigue: Secondary | ICD-10-CM | POA: Diagnosis not present

## 2022-11-02 DIAGNOSIS — S0502XA Injury of conjunctiva and corneal abrasion without foreign body, left eye, initial encounter: Secondary | ICD-10-CM | POA: Diagnosis not present

## 2022-11-13 DIAGNOSIS — J301 Allergic rhinitis due to pollen: Secondary | ICD-10-CM | POA: Diagnosis not present

## 2022-11-13 DIAGNOSIS — J3081 Allergic rhinitis due to animal (cat) (dog) hair and dander: Secondary | ICD-10-CM | POA: Diagnosis not present

## 2022-11-13 DIAGNOSIS — J3089 Other allergic rhinitis: Secondary | ICD-10-CM | POA: Diagnosis not present

## 2022-11-14 DIAGNOSIS — L821 Other seborrheic keratosis: Secondary | ICD-10-CM | POA: Diagnosis not present

## 2022-11-14 DIAGNOSIS — L814 Other melanin hyperpigmentation: Secondary | ICD-10-CM | POA: Diagnosis not present

## 2022-11-14 DIAGNOSIS — Z872 Personal history of diseases of the skin and subcutaneous tissue: Secondary | ICD-10-CM | POA: Diagnosis not present

## 2022-11-14 DIAGNOSIS — D225 Melanocytic nevi of trunk: Secondary | ICD-10-CM | POA: Diagnosis not present

## 2022-11-21 DIAGNOSIS — R972 Elevated prostate specific antigen [PSA]: Secondary | ICD-10-CM | POA: Diagnosis not present

## 2022-11-22 DIAGNOSIS — J3089 Other allergic rhinitis: Secondary | ICD-10-CM | POA: Diagnosis not present

## 2022-11-22 DIAGNOSIS — J3081 Allergic rhinitis due to animal (cat) (dog) hair and dander: Secondary | ICD-10-CM | POA: Diagnosis not present

## 2022-11-22 DIAGNOSIS — J301 Allergic rhinitis due to pollen: Secondary | ICD-10-CM | POA: Diagnosis not present

## 2022-11-28 DIAGNOSIS — M546 Pain in thoracic spine: Secondary | ICD-10-CM | POA: Diagnosis not present

## 2022-11-28 DIAGNOSIS — M7918 Myalgia, other site: Secondary | ICD-10-CM | POA: Diagnosis not present

## 2022-11-28 DIAGNOSIS — N5201 Erectile dysfunction due to arterial insufficiency: Secondary | ICD-10-CM | POA: Diagnosis not present

## 2022-11-28 DIAGNOSIS — R972 Elevated prostate specific antigen [PSA]: Secondary | ICD-10-CM | POA: Diagnosis not present

## 2022-11-28 DIAGNOSIS — R350 Frequency of micturition: Secondary | ICD-10-CM | POA: Diagnosis not present

## 2022-11-29 DIAGNOSIS — J301 Allergic rhinitis due to pollen: Secondary | ICD-10-CM | POA: Diagnosis not present

## 2022-11-29 DIAGNOSIS — J3089 Other allergic rhinitis: Secondary | ICD-10-CM | POA: Diagnosis not present

## 2022-11-29 DIAGNOSIS — J3081 Allergic rhinitis due to animal (cat) (dog) hair and dander: Secondary | ICD-10-CM | POA: Diagnosis not present

## 2022-12-06 DIAGNOSIS — J3081 Allergic rhinitis due to animal (cat) (dog) hair and dander: Secondary | ICD-10-CM | POA: Diagnosis not present

## 2022-12-06 DIAGNOSIS — J3089 Other allergic rhinitis: Secondary | ICD-10-CM | POA: Diagnosis not present

## 2022-12-06 DIAGNOSIS — J301 Allergic rhinitis due to pollen: Secondary | ICD-10-CM | POA: Diagnosis not present

## 2022-12-12 DIAGNOSIS — S6431XA Injury of digital nerve of right thumb, initial encounter: Secondary | ICD-10-CM | POA: Diagnosis not present

## 2022-12-12 DIAGNOSIS — S61011A Laceration without foreign body of right thumb without damage to nail, initial encounter: Secondary | ICD-10-CM | POA: Diagnosis not present

## 2022-12-13 DIAGNOSIS — J3081 Allergic rhinitis due to animal (cat) (dog) hair and dander: Secondary | ICD-10-CM | POA: Diagnosis not present

## 2022-12-13 DIAGNOSIS — J301 Allergic rhinitis due to pollen: Secondary | ICD-10-CM | POA: Diagnosis not present

## 2022-12-13 DIAGNOSIS — J3089 Other allergic rhinitis: Secondary | ICD-10-CM | POA: Diagnosis not present

## 2022-12-20 DIAGNOSIS — J3089 Other allergic rhinitis: Secondary | ICD-10-CM | POA: Diagnosis not present

## 2022-12-20 DIAGNOSIS — J301 Allergic rhinitis due to pollen: Secondary | ICD-10-CM | POA: Diagnosis not present

## 2022-12-20 DIAGNOSIS — J3081 Allergic rhinitis due to animal (cat) (dog) hair and dander: Secondary | ICD-10-CM | POA: Diagnosis not present

## 2023-01-01 DIAGNOSIS — M2011 Hallux valgus (acquired), right foot: Secondary | ICD-10-CM | POA: Diagnosis not present

## 2023-01-01 DIAGNOSIS — M792 Neuralgia and neuritis, unspecified: Secondary | ICD-10-CM | POA: Diagnosis not present

## 2023-01-02 DIAGNOSIS — M109 Gout, unspecified: Secondary | ICD-10-CM | POA: Diagnosis not present

## 2023-01-03 DIAGNOSIS — J3089 Other allergic rhinitis: Secondary | ICD-10-CM | POA: Diagnosis not present

## 2023-01-03 DIAGNOSIS — J3081 Allergic rhinitis due to animal (cat) (dog) hair and dander: Secondary | ICD-10-CM | POA: Diagnosis not present

## 2023-01-03 DIAGNOSIS — J301 Allergic rhinitis due to pollen: Secondary | ICD-10-CM | POA: Diagnosis not present

## 2023-01-08 DIAGNOSIS — M792 Neuralgia and neuritis, unspecified: Secondary | ICD-10-CM | POA: Diagnosis not present

## 2023-01-10 DIAGNOSIS — J301 Allergic rhinitis due to pollen: Secondary | ICD-10-CM | POA: Diagnosis not present

## 2023-01-10 DIAGNOSIS — J3089 Other allergic rhinitis: Secondary | ICD-10-CM | POA: Diagnosis not present

## 2023-01-10 DIAGNOSIS — J3081 Allergic rhinitis due to animal (cat) (dog) hair and dander: Secondary | ICD-10-CM | POA: Diagnosis not present

## 2023-01-19 ENCOUNTER — Other Ambulatory Visit (HOSPITAL_COMMUNITY): Payer: Self-pay | Admitting: Internal Medicine

## 2023-02-06 DIAGNOSIS — J301 Allergic rhinitis due to pollen: Secondary | ICD-10-CM | POA: Diagnosis not present

## 2023-02-06 DIAGNOSIS — J3089 Other allergic rhinitis: Secondary | ICD-10-CM | POA: Diagnosis not present

## 2023-02-06 DIAGNOSIS — J3081 Allergic rhinitis due to animal (cat) (dog) hair and dander: Secondary | ICD-10-CM | POA: Diagnosis not present

## 2023-02-13 DIAGNOSIS — J3081 Allergic rhinitis due to animal (cat) (dog) hair and dander: Secondary | ICD-10-CM | POA: Diagnosis not present

## 2023-02-13 DIAGNOSIS — J301 Allergic rhinitis due to pollen: Secondary | ICD-10-CM | POA: Diagnosis not present

## 2023-02-13 DIAGNOSIS — J3089 Other allergic rhinitis: Secondary | ICD-10-CM | POA: Diagnosis not present

## 2023-02-21 DIAGNOSIS — Z0189 Encounter for other specified special examinations: Secondary | ICD-10-CM | POA: Diagnosis not present

## 2023-02-21 DIAGNOSIS — J029 Acute pharyngitis, unspecified: Secondary | ICD-10-CM | POA: Diagnosis not present

## 2023-02-21 DIAGNOSIS — R7301 Impaired fasting glucose: Secondary | ICD-10-CM | POA: Diagnosis not present

## 2023-02-21 DIAGNOSIS — Z125 Encounter for screening for malignant neoplasm of prostate: Secondary | ICD-10-CM | POA: Diagnosis not present

## 2023-02-21 DIAGNOSIS — E781 Pure hyperglyceridemia: Secondary | ICD-10-CM | POA: Diagnosis not present

## 2023-02-21 DIAGNOSIS — I10 Essential (primary) hypertension: Secondary | ICD-10-CM | POA: Diagnosis not present

## 2023-02-21 DIAGNOSIS — E785 Hyperlipidemia, unspecified: Secondary | ICD-10-CM | POA: Diagnosis not present

## 2023-02-28 DIAGNOSIS — Z1331 Encounter for screening for depression: Secondary | ICD-10-CM | POA: Diagnosis not present

## 2023-02-28 DIAGNOSIS — Z0000 Encounter for general adult medical examination without abnormal findings: Secondary | ICD-10-CM | POA: Diagnosis not present

## 2023-02-28 DIAGNOSIS — I10 Essential (primary) hypertension: Secondary | ICD-10-CM | POA: Diagnosis not present

## 2023-02-28 DIAGNOSIS — Z1339 Encounter for screening examination for other mental health and behavioral disorders: Secondary | ICD-10-CM | POA: Diagnosis not present

## 2023-02-28 DIAGNOSIS — Z23 Encounter for immunization: Secondary | ICD-10-CM | POA: Diagnosis not present

## 2023-03-06 DIAGNOSIS — J301 Allergic rhinitis due to pollen: Secondary | ICD-10-CM | POA: Diagnosis not present

## 2023-03-06 DIAGNOSIS — J3081 Allergic rhinitis due to animal (cat) (dog) hair and dander: Secondary | ICD-10-CM | POA: Diagnosis not present

## 2023-03-06 DIAGNOSIS — J3089 Other allergic rhinitis: Secondary | ICD-10-CM | POA: Diagnosis not present

## 2023-04-03 DIAGNOSIS — J301 Allergic rhinitis due to pollen: Secondary | ICD-10-CM | POA: Diagnosis not present

## 2023-04-03 DIAGNOSIS — J3089 Other allergic rhinitis: Secondary | ICD-10-CM | POA: Diagnosis not present

## 2023-04-03 DIAGNOSIS — J3081 Allergic rhinitis due to animal (cat) (dog) hair and dander: Secondary | ICD-10-CM | POA: Diagnosis not present

## 2023-04-11 DIAGNOSIS — J3081 Allergic rhinitis due to animal (cat) (dog) hair and dander: Secondary | ICD-10-CM | POA: Diagnosis not present

## 2023-04-11 DIAGNOSIS — J3089 Other allergic rhinitis: Secondary | ICD-10-CM | POA: Diagnosis not present

## 2023-04-11 DIAGNOSIS — J301 Allergic rhinitis due to pollen: Secondary | ICD-10-CM | POA: Diagnosis not present

## 2023-04-11 DIAGNOSIS — T783XXD Angioneurotic edema, subsequent encounter: Secondary | ICD-10-CM | POA: Diagnosis not present

## 2023-04-23 ENCOUNTER — Other Ambulatory Visit (HOSPITAL_COMMUNITY): Payer: Self-pay | Admitting: Internal Medicine

## 2023-04-25 DIAGNOSIS — J3081 Allergic rhinitis due to animal (cat) (dog) hair and dander: Secondary | ICD-10-CM | POA: Diagnosis not present

## 2023-04-25 DIAGNOSIS — J3089 Other allergic rhinitis: Secondary | ICD-10-CM | POA: Diagnosis not present

## 2023-04-25 DIAGNOSIS — J301 Allergic rhinitis due to pollen: Secondary | ICD-10-CM | POA: Diagnosis not present

## 2023-04-30 DIAGNOSIS — J3081 Allergic rhinitis due to animal (cat) (dog) hair and dander: Secondary | ICD-10-CM | POA: Diagnosis not present

## 2023-04-30 DIAGNOSIS — J3089 Other allergic rhinitis: Secondary | ICD-10-CM | POA: Diagnosis not present

## 2023-04-30 DIAGNOSIS — J301 Allergic rhinitis due to pollen: Secondary | ICD-10-CM | POA: Diagnosis not present

## 2023-05-01 DIAGNOSIS — J3089 Other allergic rhinitis: Secondary | ICD-10-CM | POA: Diagnosis not present

## 2023-05-09 DIAGNOSIS — J3081 Allergic rhinitis due to animal (cat) (dog) hair and dander: Secondary | ICD-10-CM | POA: Diagnosis not present

## 2023-05-09 DIAGNOSIS — J301 Allergic rhinitis due to pollen: Secondary | ICD-10-CM | POA: Diagnosis not present

## 2023-05-09 DIAGNOSIS — J3089 Other allergic rhinitis: Secondary | ICD-10-CM | POA: Diagnosis not present

## 2023-05-14 DIAGNOSIS — J3081 Allergic rhinitis due to animal (cat) (dog) hair and dander: Secondary | ICD-10-CM | POA: Diagnosis not present

## 2023-05-14 DIAGNOSIS — J301 Allergic rhinitis due to pollen: Secondary | ICD-10-CM | POA: Diagnosis not present

## 2023-05-14 DIAGNOSIS — J3089 Other allergic rhinitis: Secondary | ICD-10-CM | POA: Diagnosis not present

## 2023-05-22 DIAGNOSIS — J3089 Other allergic rhinitis: Secondary | ICD-10-CM | POA: Diagnosis not present

## 2023-05-22 DIAGNOSIS — J3081 Allergic rhinitis due to animal (cat) (dog) hair and dander: Secondary | ICD-10-CM | POA: Diagnosis not present

## 2023-05-22 DIAGNOSIS — J301 Allergic rhinitis due to pollen: Secondary | ICD-10-CM | POA: Diagnosis not present

## 2023-05-29 DIAGNOSIS — J3089 Other allergic rhinitis: Secondary | ICD-10-CM | POA: Diagnosis not present

## 2023-05-29 DIAGNOSIS — J3081 Allergic rhinitis due to animal (cat) (dog) hair and dander: Secondary | ICD-10-CM | POA: Diagnosis not present

## 2023-05-29 DIAGNOSIS — J301 Allergic rhinitis due to pollen: Secondary | ICD-10-CM | POA: Diagnosis not present

## 2023-06-04 DIAGNOSIS — J3081 Allergic rhinitis due to animal (cat) (dog) hair and dander: Secondary | ICD-10-CM | POA: Diagnosis not present

## 2023-06-04 DIAGNOSIS — J301 Allergic rhinitis due to pollen: Secondary | ICD-10-CM | POA: Diagnosis not present

## 2023-06-04 DIAGNOSIS — J3089 Other allergic rhinitis: Secondary | ICD-10-CM | POA: Diagnosis not present

## 2023-06-18 DIAGNOSIS — J3089 Other allergic rhinitis: Secondary | ICD-10-CM | POA: Diagnosis not present

## 2023-06-18 DIAGNOSIS — J301 Allergic rhinitis due to pollen: Secondary | ICD-10-CM | POA: Diagnosis not present

## 2023-06-18 DIAGNOSIS — J3081 Allergic rhinitis due to animal (cat) (dog) hair and dander: Secondary | ICD-10-CM | POA: Diagnosis not present

## 2023-06-19 DIAGNOSIS — R350 Frequency of micturition: Secondary | ICD-10-CM | POA: Diagnosis not present

## 2023-06-19 DIAGNOSIS — R972 Elevated prostate specific antigen [PSA]: Secondary | ICD-10-CM | POA: Diagnosis not present

## 2023-06-26 DIAGNOSIS — J301 Allergic rhinitis due to pollen: Secondary | ICD-10-CM | POA: Diagnosis not present

## 2023-06-26 DIAGNOSIS — J3081 Allergic rhinitis due to animal (cat) (dog) hair and dander: Secondary | ICD-10-CM | POA: Diagnosis not present

## 2023-06-26 DIAGNOSIS — R972 Elevated prostate specific antigen [PSA]: Secondary | ICD-10-CM | POA: Diagnosis not present

## 2023-06-26 DIAGNOSIS — J3089 Other allergic rhinitis: Secondary | ICD-10-CM | POA: Diagnosis not present

## 2023-06-26 DIAGNOSIS — R3912 Poor urinary stream: Secondary | ICD-10-CM | POA: Diagnosis not present

## 2023-07-11 DIAGNOSIS — J301 Allergic rhinitis due to pollen: Secondary | ICD-10-CM | POA: Diagnosis not present

## 2023-07-11 DIAGNOSIS — J3081 Allergic rhinitis due to animal (cat) (dog) hair and dander: Secondary | ICD-10-CM | POA: Diagnosis not present

## 2023-07-11 DIAGNOSIS — J3089 Other allergic rhinitis: Secondary | ICD-10-CM | POA: Diagnosis not present

## 2023-07-18 DIAGNOSIS — J3081 Allergic rhinitis due to animal (cat) (dog) hair and dander: Secondary | ICD-10-CM | POA: Diagnosis not present

## 2023-07-18 DIAGNOSIS — J3089 Other allergic rhinitis: Secondary | ICD-10-CM | POA: Diagnosis not present

## 2023-07-18 DIAGNOSIS — J301 Allergic rhinitis due to pollen: Secondary | ICD-10-CM | POA: Diagnosis not present

## 2023-07-21 ENCOUNTER — Other Ambulatory Visit (HOSPITAL_COMMUNITY): Payer: Self-pay | Admitting: Internal Medicine

## 2023-07-24 DIAGNOSIS — J3089 Other allergic rhinitis: Secondary | ICD-10-CM | POA: Diagnosis not present

## 2023-07-24 DIAGNOSIS — J3081 Allergic rhinitis due to animal (cat) (dog) hair and dander: Secondary | ICD-10-CM | POA: Diagnosis not present

## 2023-07-24 DIAGNOSIS — J301 Allergic rhinitis due to pollen: Secondary | ICD-10-CM | POA: Diagnosis not present

## 2023-07-28 ENCOUNTER — Telehealth (HOSPITAL_COMMUNITY): Payer: Self-pay | Admitting: *Deleted

## 2023-07-28 NOTE — Telephone Encounter (Signed)
 Called to confirm/remind patient of their appointment at the [facility] on 07/29/23.     Appointment:              [x] Confirmed             [] Left mess              [] No answer/No voice mail             [] Phone not in service   Patient reminded to bring all medications and/or complete list.   Confirmed patient has transportation. Gave directions, instructed to utilize valet parking.

## 2023-07-29 ENCOUNTER — Ambulatory Visit (HOSPITAL_COMMUNITY)
Admission: RE | Admit: 2023-07-29 | Discharge: 2023-07-29 | Disposition: A | Discharge status: Home / Self Care | Source: Ambulatory Visit | Attending: Adult Health | Admitting: Adult Health

## 2023-07-29 ENCOUNTER — Encounter (HOSPITAL_COMMUNITY): Payer: Self-pay

## 2023-07-29 VITALS — BP 118/66 | HR 72 | Wt 186.8 lb

## 2023-07-29 DIAGNOSIS — E782 Mixed hyperlipidemia: Secondary | ICD-10-CM | POA: Insufficient documentation

## 2023-07-29 MED ORDER — ROSUVASTATIN CALCIUM 40 MG PO TABS: 40.0000 mg | ORAL_TABLET | Freq: Every day | ORAL | 3 refills | Status: AC

## 2023-07-29 NOTE — Progress Notes (Incomplete)
 CARDIOLOGY CLINIC VISIT   PCP: Jesse Horne Cardoiology: Dr Julane Ny.   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 13 minutes on Bruce. Negative ECG.  ECHO 2010: EF 60%  CAC 1/20 = 11  Today he returns for HF follow up.Overall feeling fine. Denies SOB/PND/Orthopnea. nO chest pain. Exercising 3-4 days per week. Appetite ok. No fever or chills. Taking all medications   Father with CAD/MI in 73s. Now 98.5 y/o (12/19)   SH:  Social History   Socioeconomic History   Marital status: Married    Spouse name: Jesse Horne   Number of children: 3   Years of education: Not on file   Highest education level: Not on file  Occupational History   Not on file  Tobacco Use   Smoking status: Former    Current packs/day: 0.00    Types: Cigarettes    Quit date: 02/25/2002    Years since quitting: 21.4   Smokeless tobacco: Never  Vaping Use   Vaping status: Never Used  Substance and Sexual Activity   Alcohol  use: Yes    Alcohol /week: 2.0 standard drinks of alcohol     Types: 2 Standard drinks or equivalent per week    Comment: occasional   Drug use: Not Currently   Sexual activity: Not on file  Other Topics Concern   Not on file  Social History Narrative   Not on file   Social Drivers of Health   Financial Resource Strain: Not on file  Food Insecurity: Not on file  Transportation Needs: Not on file  Physical Activity: Not on file  Stress: Not on file  Social Connections: Unknown (07/10/2021)   [facility] from [facility], [facility]   Social Network    Social Network: Not on file  Intimate Partner Violence: Unknown (06/01/2021)   Received from [employer], [facility]   HITS    Physically Hurt: Not on file    Insult or Talk Down To: Not on file    Threaten Physical Harm: Not on file    Scream or Curse: Not on file    FH:  Family History  Problem Relation Age of Onset   Cancer Mother         Melanoma, Basal Cell Carcinoma, Breast   Heart disease Father    Cancer Sister        Breast   Heart disease Sister    Colon cancer Neg Hx    Stomach cancer Neg Hx    Rectal cancer Neg Hx     Past Medical History:  Diagnosis Date   At risk for sleep apnea    STOP-BANG= 4    SENT TO PCP 12-24-2013   Family history of anesthesia complication    SISTER--  PONV   Hyperlipidemia    Hypertension    Internal hemorrhoid, bleeding    Kidney stones    Seasonal and perennial allergic rhinitis    Wears glasses     Current Outpatient Medications  Medication Sig Dispense Refill   atorvastatin  (LIPITOR) 40 MG tablet TAKE 1 TABLET BY MOUTH EVERY DAY 90 tablet 0   azelastine (ASTELIN) 0.1 % nasal spray Place 1 spray into both nostrils 2 (two) times daily.  3   DYMISTA 137-50 MCG/ACT SUSP SPRAY 1 SPRAY INTO EACH NOSTRIL TWICE A DAY  3   EPIPEN  2-PAK 0.3 MG/0.3ML SOAJ Inject 0.3 mg into the muscle as needed.  ibuprofen  (ADVIL ) 200 MG tablet Take 200 mg by mouth every 6 (six) hours as needed.     levocetirizine (XYZAL) 5 MG tablet Take 5 mg by mouth every morning.      montelukast (SINGULAIR) 10 MG tablet Take 10 mg by mouth at bedtime.     Psyllium-Calcium  (METAMUCIL PLUS CALCIUM ) CAPS Take 8 capsules by mouth daily.     sildenafil (VIAGRA) 100 MG tablet Take 100 mg by mouth as needed for erectile dysfunction.     valsartan -hydrochlorothiazide  (DIOVAN -HCT) 160-12.5 MG tablet TAKE 1 TABLET BY MOUTH DAILY. 90 tablet 1   vitamin C (ASCORBIC ACID) 500 MG tablet Take 500 mg by mouth daily.     vitamin E 400 UNIT capsule Take 400 Units by mouth daily.     No current facility-administered medications for this encounter.    Vitals:   07/29/23 1539  BP: 118/66  Pulse: 72  SpO2: 98%  Weight: 84.7 kg (186 lb 12.8 oz)     PHYSICAL EXAM: General:   No resp difficulty Neck: supple. no JVD.  Cor: PMI nondisplaced. Regular rate & rhythm. No rubs, gallops or murmurs. Lungs:  clear Abdomen: soft, nontender, nondistended.  Extremities: no cyanosis, clubbing, rash, edema Neuro: alert & oriented x3   ASSESSMENT & PLAN: 1. HTN - Stable.   2. HL - Statin follow by PCP.  - Continue statin  3. CV risk - CAC 1/20 = 11 -CAC 2023=38  - Repeat CAC - Lipids 01/2023 Cholester 179 Tri 165 hDL 58 LDL 93 . Would like LDL < 70 - Stop atorvastatin . Switch to crestor 40 mg daily  -  continue RF modification   Discussed with Dr Bensimhon. Follow up in 1 year.   Nieves Bars, NP  3:53 PM

## 2023-07-29 NOTE — Patient Instructions (Addendum)
 Great to see you today!!!  STOP Atorvastatin   START Crestor 40 mg Daily   CT Coronary Calcium  Score: Your physician has recommended that you have CT Coronary Calcium  Score. - $99 out of pocket cost at the time of your test   Your physician recommends that you schedule a follow-up appointment in: 1 year with Dr Bensimhon (June 2026), **PLEASE CALL OUR OFFICE IN MARCH TO SCHEDULE THIS APPOINTMENT  If you have any questions or concerns before your next appointment please send us  a message through [place] or call our office at [phone].    TO LEAVE A MESSAGE FOR THE NURSE SELECT OPTION 2, PLEASE LEAVE A MESSAGE INCLUDING: YOUR NAME DATE OF BIRTH CALL BACK NUMBER REASON FOR CALL**this is important as we prioritize the call backs  YOU WILL RECEIVE A CALL BACK THE SAME DAY AS LONG AS YOU CALL BEFORE 4:00 PM  At the [facility], you and your health needs are our priority. As part of our continuing mission to provide you with exceptional heart care, we have created designated Provider Care Teams. These Care Teams include your primary Cardiologist (physician) and Advanced Practice Providers (APPs- Physician Assistants and Nurse Practitioners) who all work together to provide you with the care you need, when you need it.   You may see any of the following providers on your designated Care Team at your next follow up: Dr Jules Oar Dr Peder Bourdon Dr. Alwin Baars Dr. Arta Lark Amy Marijane Shoulders, NP Ruddy Corral, [place] [facility] Alcan Border, [place] Dennise Fitz, NP [place] Lee, NP Shawnee Dellen, NP Luster Salters, PharmD Bevely Brush, PharmD   Please be sure to bring in all your medications bottles to every appointment.    Thank you for choosing [facility]

## 2023-08-01 DIAGNOSIS — J301 Allergic rhinitis due to pollen: Secondary | ICD-10-CM | POA: Diagnosis not present

## 2023-08-01 DIAGNOSIS — J3081 Allergic rhinitis due to animal (cat) (dog) hair and dander: Secondary | ICD-10-CM | POA: Diagnosis not present

## 2023-08-01 DIAGNOSIS — J3089 Other allergic rhinitis: Secondary | ICD-10-CM | POA: Diagnosis not present

## 2023-08-08 DIAGNOSIS — J301 Allergic rhinitis due to pollen: Secondary | ICD-10-CM | POA: Diagnosis not present

## 2023-08-08 DIAGNOSIS — J3081 Allergic rhinitis due to animal (cat) (dog) hair and dander: Secondary | ICD-10-CM | POA: Diagnosis not present

## 2023-08-08 DIAGNOSIS — J3089 Other allergic rhinitis: Secondary | ICD-10-CM | POA: Diagnosis not present

## 2023-08-15 DIAGNOSIS — J3081 Allergic rhinitis due to animal (cat) (dog) hair and dander: Secondary | ICD-10-CM | POA: Diagnosis not present

## 2023-08-15 DIAGNOSIS — J3089 Other allergic rhinitis: Secondary | ICD-10-CM | POA: Diagnosis not present

## 2023-08-15 DIAGNOSIS — J301 Allergic rhinitis due to pollen: Secondary | ICD-10-CM | POA: Diagnosis not present

## 2023-08-20 DIAGNOSIS — J3089 Other allergic rhinitis: Secondary | ICD-10-CM | POA: Diagnosis not present

## 2023-08-20 DIAGNOSIS — J3081 Allergic rhinitis due to animal (cat) (dog) hair and dander: Secondary | ICD-10-CM | POA: Diagnosis not present

## 2023-08-20 DIAGNOSIS — J301 Allergic rhinitis due to pollen: Secondary | ICD-10-CM | POA: Diagnosis not present

## 2023-08-21 ENCOUNTER — Ambulatory Visit (HOSPITAL_BASED_OUTPATIENT_CLINIC_OR_DEPARTMENT_OTHER)
Admission: RE | Admit: 2023-08-21 | Discharge: 2023-08-21 | Disposition: A | Discharge status: Home / Self Care | Payer: Self-pay | Source: Ambulatory Visit | Attending: Adult Health | Admitting: Adult Health

## 2023-08-21 DIAGNOSIS — E782 Mixed hyperlipidemia: Secondary | ICD-10-CM | POA: Insufficient documentation

## 2023-08-28 DIAGNOSIS — J301 Allergic rhinitis due to pollen: Secondary | ICD-10-CM | POA: Diagnosis not present

## 2023-08-28 DIAGNOSIS — J3089 Other allergic rhinitis: Secondary | ICD-10-CM | POA: Diagnosis not present

## 2023-08-28 DIAGNOSIS — J3081 Allergic rhinitis due to animal (cat) (dog) hair and dander: Secondary | ICD-10-CM | POA: Diagnosis not present

## 2023-09-01 ENCOUNTER — Ambulatory Visit (HOSPITAL_COMMUNITY): Payer: Self-pay | Admitting: Adult Health

## 2023-09-01 NOTE — Telephone Encounter (Signed)
 Called patient per Greig Mosses, NP with following:  Coronary Calcium  Score =30. Previous Score 38. Continue statin. Please call.  Pt verbalized understanding of same and is pleased with results.

## 2023-09-04 DIAGNOSIS — J3081 Allergic rhinitis due to animal (cat) (dog) hair and dander: Secondary | ICD-10-CM | POA: Diagnosis not present

## 2023-09-04 DIAGNOSIS — J3089 Other allergic rhinitis: Secondary | ICD-10-CM | POA: Diagnosis not present

## 2023-09-04 DIAGNOSIS — J301 Allergic rhinitis due to pollen: Secondary | ICD-10-CM | POA: Diagnosis not present

## 2023-09-17 DIAGNOSIS — J3081 Allergic rhinitis due to animal (cat) (dog) hair and dander: Secondary | ICD-10-CM | POA: Diagnosis not present

## 2023-09-17 DIAGNOSIS — J3089 Other allergic rhinitis: Secondary | ICD-10-CM | POA: Diagnosis not present

## 2023-09-17 DIAGNOSIS — J301 Allergic rhinitis due to pollen: Secondary | ICD-10-CM | POA: Diagnosis not present

## 2023-09-26 DIAGNOSIS — J301 Allergic rhinitis due to pollen: Secondary | ICD-10-CM | POA: Diagnosis not present

## 2023-09-26 DIAGNOSIS — J3081 Allergic rhinitis due to animal (cat) (dog) hair and dander: Secondary | ICD-10-CM | POA: Diagnosis not present

## 2023-09-26 DIAGNOSIS — J3089 Other allergic rhinitis: Secondary | ICD-10-CM | POA: Diagnosis not present

## 2023-10-03 DIAGNOSIS — J3081 Allergic rhinitis due to animal (cat) (dog) hair and dander: Secondary | ICD-10-CM | POA: Diagnosis not present

## 2023-10-03 DIAGNOSIS — J301 Allergic rhinitis due to pollen: Secondary | ICD-10-CM | POA: Diagnosis not present

## 2023-10-03 DIAGNOSIS — J3089 Other allergic rhinitis: Secondary | ICD-10-CM | POA: Diagnosis not present

## 2023-10-09 DIAGNOSIS — J301 Allergic rhinitis due to pollen: Secondary | ICD-10-CM | POA: Diagnosis not present

## 2023-10-09 DIAGNOSIS — J3089 Other allergic rhinitis: Secondary | ICD-10-CM | POA: Diagnosis not present

## 2023-10-09 DIAGNOSIS — J3081 Allergic rhinitis due to animal (cat) (dog) hair and dander: Secondary | ICD-10-CM | POA: Diagnosis not present

## 2023-10-22 ENCOUNTER — Other Ambulatory Visit (HOSPITAL_COMMUNITY): Payer: Self-pay | Admitting: Internal Medicine

## 2023-10-22 DIAGNOSIS — J3089 Other allergic rhinitis: Secondary | ICD-10-CM | POA: Diagnosis not present

## 2023-10-22 DIAGNOSIS — J3081 Allergic rhinitis due to animal (cat) (dog) hair and dander: Secondary | ICD-10-CM | POA: Diagnosis not present

## 2023-10-22 DIAGNOSIS — J301 Allergic rhinitis due to pollen: Secondary | ICD-10-CM | POA: Diagnosis not present

## 2023-10-28 DIAGNOSIS — J3081 Allergic rhinitis due to animal (cat) (dog) hair and dander: Secondary | ICD-10-CM | POA: Diagnosis not present

## 2023-10-28 DIAGNOSIS — J301 Allergic rhinitis due to pollen: Secondary | ICD-10-CM | POA: Diagnosis not present

## 2023-10-28 DIAGNOSIS — J3089 Other allergic rhinitis: Secondary | ICD-10-CM | POA: Diagnosis not present

## 2023-11-07 DIAGNOSIS — J3089 Other allergic rhinitis: Secondary | ICD-10-CM | POA: Diagnosis not present

## 2023-11-07 DIAGNOSIS — J301 Allergic rhinitis due to pollen: Secondary | ICD-10-CM | POA: Diagnosis not present

## 2023-11-07 DIAGNOSIS — J3081 Allergic rhinitis due to animal (cat) (dog) hair and dander: Secondary | ICD-10-CM | POA: Diagnosis not present

## 2023-11-14 DIAGNOSIS — J301 Allergic rhinitis due to pollen: Secondary | ICD-10-CM | POA: Diagnosis not present

## 2023-11-14 DIAGNOSIS — J3089 Other allergic rhinitis: Secondary | ICD-10-CM | POA: Diagnosis not present

## 2023-11-14 DIAGNOSIS — J3081 Allergic rhinitis due to animal (cat) (dog) hair and dander: Secondary | ICD-10-CM | POA: Diagnosis not present

## 2023-11-20 DIAGNOSIS — J301 Allergic rhinitis due to pollen: Secondary | ICD-10-CM | POA: Diagnosis not present

## 2023-11-20 DIAGNOSIS — J3089 Other allergic rhinitis: Secondary | ICD-10-CM | POA: Diagnosis not present

## 2023-11-20 DIAGNOSIS — J3081 Allergic rhinitis due to animal (cat) (dog) hair and dander: Secondary | ICD-10-CM | POA: Diagnosis not present

## 2023-11-24 DIAGNOSIS — J301 Allergic rhinitis due to pollen: Secondary | ICD-10-CM | POA: Diagnosis not present

## 2023-11-24 DIAGNOSIS — J3081 Allergic rhinitis due to animal (cat) (dog) hair and dander: Secondary | ICD-10-CM | POA: Diagnosis not present

## 2023-11-25 DIAGNOSIS — J3081 Allergic rhinitis due to animal (cat) (dog) hair and dander: Secondary | ICD-10-CM | POA: Diagnosis not present

## 2023-11-25 DIAGNOSIS — J3089 Other allergic rhinitis: Secondary | ICD-10-CM | POA: Diagnosis not present

## 2023-11-28 DIAGNOSIS — J301 Allergic rhinitis due to pollen: Secondary | ICD-10-CM | POA: Diagnosis not present

## 2023-11-28 DIAGNOSIS — J3089 Other allergic rhinitis: Secondary | ICD-10-CM | POA: Diagnosis not present

## 2023-12-05 DIAGNOSIS — J3081 Allergic rhinitis due to animal (cat) (dog) hair and dander: Secondary | ICD-10-CM | POA: Diagnosis not present

## 2023-12-05 DIAGNOSIS — J3089 Other allergic rhinitis: Secondary | ICD-10-CM | POA: Diagnosis not present

## 2023-12-05 DIAGNOSIS — J301 Allergic rhinitis due to pollen: Secondary | ICD-10-CM | POA: Diagnosis not present

## 2023-12-26 DIAGNOSIS — J3081 Allergic rhinitis due to animal (cat) (dog) hair and dander: Secondary | ICD-10-CM | POA: Diagnosis not present

## 2023-12-26 DIAGNOSIS — J301 Allergic rhinitis due to pollen: Secondary | ICD-10-CM | POA: Diagnosis not present

## 2023-12-26 DIAGNOSIS — J3089 Other allergic rhinitis: Secondary | ICD-10-CM | POA: Diagnosis not present

## 2024-01-02 DIAGNOSIS — J301 Allergic rhinitis due to pollen: Secondary | ICD-10-CM | POA: Diagnosis not present

## 2024-01-02 DIAGNOSIS — J3089 Other allergic rhinitis: Secondary | ICD-10-CM | POA: Diagnosis not present

## 2024-01-02 DIAGNOSIS — J3081 Allergic rhinitis due to animal (cat) (dog) hair and dander: Secondary | ICD-10-CM | POA: Diagnosis not present

## 2024-01-09 DIAGNOSIS — J3089 Other allergic rhinitis: Secondary | ICD-10-CM | POA: Diagnosis not present

## 2024-01-09 DIAGNOSIS — J3081 Allergic rhinitis due to animal (cat) (dog) hair and dander: Secondary | ICD-10-CM | POA: Diagnosis not present

## 2024-01-09 DIAGNOSIS — J301 Allergic rhinitis due to pollen: Secondary | ICD-10-CM | POA: Diagnosis not present

## 2024-01-15 DIAGNOSIS — J3089 Other allergic rhinitis: Secondary | ICD-10-CM | POA: Diagnosis not present

## 2024-01-15 DIAGNOSIS — J3081 Allergic rhinitis due to animal (cat) (dog) hair and dander: Secondary | ICD-10-CM | POA: Diagnosis not present

## 2024-01-15 DIAGNOSIS — J301 Allergic rhinitis due to pollen: Secondary | ICD-10-CM | POA: Diagnosis not present

## 2024-01-27 DIAGNOSIS — H40013 Open angle with borderline findings, low risk, bilateral: Secondary | ICD-10-CM | POA: Diagnosis not present

## 2024-01-29 DIAGNOSIS — J3089 Other allergic rhinitis: Secondary | ICD-10-CM | POA: Diagnosis not present

## 2024-01-29 DIAGNOSIS — J3081 Allergic rhinitis due to animal (cat) (dog) hair and dander: Secondary | ICD-10-CM | POA: Diagnosis not present

## 2024-01-29 DIAGNOSIS — J301 Allergic rhinitis due to pollen: Secondary | ICD-10-CM | POA: Diagnosis not present

## 2024-02-12 DIAGNOSIS — J3089 Other allergic rhinitis: Secondary | ICD-10-CM | POA: Diagnosis not present

## 2024-02-12 DIAGNOSIS — J3081 Allergic rhinitis due to animal (cat) (dog) hair and dander: Secondary | ICD-10-CM | POA: Diagnosis not present

## 2024-02-12 DIAGNOSIS — J301 Allergic rhinitis due to pollen: Secondary | ICD-10-CM | POA: Diagnosis not present

## 2024-02-17 ENCOUNTER — Ambulatory Visit (INDEPENDENT_AMBULATORY_CARE_PROVIDER_SITE_OTHER)

## 2024-02-17 ENCOUNTER — Ambulatory Visit

## 2024-02-17 DIAGNOSIS — M109 Gout, unspecified: Secondary | ICD-10-CM

## 2024-02-17 MED ORDER — INDOMETHACIN 50 MG PO CAPS: 50.0000 mg | ORAL_CAPSULE | Freq: Three times a day (TID) | ORAL | 0 refills | Status: DC

## 2024-02-17 NOTE — Progress Notes (Signed)
 "    Subjective:  Patient ID: Jesse Horne, male    DOB: 1960/05/22,  MRN: [id number]  Chief Complaint  Patient presents with   Gout    Rm2 Patient complains of pain left foot 1st mtpj/ 2 weeks / swollen, red and very painful to walk on.    63 y.o. male presents with the above complaint. He states he has had pain, swelling, and redness to his left big toe joint for 2 weeks. He has had gout in his right foot before and states this feels similar. He states he is still in acute pain, but less severe than a couple days before.   Review of Systems: Negative except as noted in the HPI. Denies N/V/F/Ch.  Past Medical History:  Diagnosis Date   At risk for sleep apnea    STOP-BANG= 4    SENT TO PCP 12-24-2013   Family history of anesthesia complication    SISTER--  PONV   Hyperlipidemia    Hypertension    Internal hemorrhoid, bleeding    Kidney stones    Seasonal and perennial allergic rhinitis    Wears glasses    Current Medications[1]  Tobacco Use History[2]  Allergies[3] Objective:  There were no vitals filed for this visit. There is no height or weight on file to calculate BMI. Constitutional Well developed. Well nourished. Oriented to person, place, and time.  Vascular Dorsalis pedis pulses palpable bilaterally. Posterior tibial pulses palpable bilaterally. Capillary refill normal to all digits.  No cyanosis or clubbing noted. Pedal hair growth normal.  Neurologic Normal speech. Epicritic sensation to light touch grossly present bilaterally. Negative tinel sign at tarsal tunnel bilaterally.   Dermatologic Skin texture and turgor are within normal limits.  No open wounds. No skin lesions. No drainage. No sign of infection.   Musculoskeletal: 5/5 muscle strength to all major pedal muscle groups. Left 1st MTP erythematous, edematous, and painful to palpation. Minimal pain with range of motion. No hallux abducto valgus. Rectus foot shape.    Radiographs: Taken and  reviewed. 3 views of left foot taken. These demonstrate a well defined cortical erosion in the dorsomedial 1st metatarsal head. Joint space maintained. Bipartite sesamoid.  Metadductus foot shape. No other acute osseous abnormalities identified  Assessment:   1. Gout of left foot, unspecified cause, unspecified chronicity    Plan:  - Patient was evaluated and treated and all questions answered.  1. Gout of left foot, unspecified cause, unspecified chronicity - Discussed with the patient the diagnosis, etiology, and treatment options for acute gout. He expressed understanding and relates that it feels very similar to when he had gout in his right big toe joint.  - Patient would like to proceed with intra-articular injection to his left 1st MTP today for symptomatic relief. Procedure note below. I placed a prescription for indomethacin  TID to his pharmacy x 30 days to allow for continued relief from gout. - Discussed recommendation for him to talk to PCP about chronic management and potential need for uric acid lowering medications   - RTC PRN  Procedure: Injection small joint Location: Left 1st MTP from medial approach Skin Prep: alcohol  Injectate: 0.5 cc 0.5% marcaine  plain, 0.5 cc of 1% Lidocaine , 0.5 cc kenalog  10. Disposition: Patient tolerated procedure well. Injection site dressed with a band-aid.     No follow-ups on file.  Prentice Ovens, DPM AACFAS Fellowship Trained Podiatric Surgeon [facility]      [1]  Current Outpatient Medications:  indomethacin  (INDOCIN ) 50 MG capsule, Take 1 capsule (50 mg total) by mouth 3 (three) times daily with meals., Disp: 90 capsule, Rfl: 0   azelastine (ASTELIN) 0.1 % nasal spray, Place 1 spray into both nostrils 2 (two) times daily., Disp: , Rfl: 3   DYMISTA 137-50 MCG/ACT SUSP, SPRAY 1 SPRAY INTO EACH NOSTRIL TWICE A DAY, Disp: , Rfl: 3   EPIPEN  2-PAK 0.3 MG/0.3ML SOAJ, Inject 0.3 mg into the muscle as needed., Disp: ,  Rfl:    ibuprofen  (ADVIL ) 200 MG tablet, Take 200 mg by mouth every 6 (six) hours as needed., Disp: , Rfl:    levocetirizine (XYZAL) 5 MG tablet, Take 5 mg by mouth every morning. , Disp: , Rfl:    montelukast (SINGULAIR) 10 MG tablet, Take 10 mg by mouth at bedtime., Disp: , Rfl:    Psyllium-Calcium  (METAMUCIL PLUS CALCIUM ) CAPS, Take 8 capsules by mouth daily., Disp: , Rfl:    rosuvastatin  (CRESTOR ) 40 MG tablet, Take 1 tablet (40 mg total) by mouth daily., Disp: 90 tablet, Rfl: 3   sildenafil (VIAGRA) 100 MG tablet, Take 100 mg by mouth as needed for erectile dysfunction., Disp: , Rfl:    valsartan -hydrochlorothiazide  (DIOVAN -HCT) 160-12.5 MG tablet, TAKE 1 TABLET BY MOUTH DAILY., Disp: 90 tablet, Rfl: 1   vitamin C (ASCORBIC ACID) 500 MG tablet, Take 500 mg by mouth daily., Disp: , Rfl:    vitamin E 400 UNIT capsule, Take 400 Units by mouth daily., Disp: , Rfl:  [2]  Social History Tobacco Use  Smoking Status Former   Current packs/day: 0.00   Types: Cigarettes   Quit date: 02/25/2002   Years since quitting: 21.9  Smokeless Tobacco Never  [3] No Known Allergies  "

## 2024-02-18 ENCOUNTER — Other Ambulatory Visit: Payer: Self-pay

## 2024-02-18 DIAGNOSIS — M109 Gout, unspecified: Secondary | ICD-10-CM | POA: Diagnosis not present

## 2024-02-18 MED ORDER — INDOMETHACIN 50 MG PO CAPS: 50.0000 mg | ORAL_CAPSULE | Freq: Three times a day (TID) | ORAL | 0 refills | Status: DC

## 2024-02-18 MED ORDER — TRIAMCINOLONE ACETONIDE 10 MG/ML IJ SUSP
5.0000 mg | Freq: Once | INTRAMUSCULAR | Status: AC
  Administered 2024-02-18: 5 mg via INTRA_ARTICULAR

## 2024-03-16 ENCOUNTER — Other Ambulatory Visit: Payer: Self-pay

## 2024-03-24 ENCOUNTER — Ambulatory Visit: Admitting: Podiatry

## 2024-03-24 DIAGNOSIS — M109 Gout, unspecified: Secondary | ICD-10-CM

## 2024-03-24 DIAGNOSIS — M7751 Other enthesopathy of right foot: Secondary | ICD-10-CM

## 2024-03-24 NOTE — Progress Notes (Signed)
 Subjective:   Patient ID: Jesse Horne, male   DOB: 64 y.o.   MRN: [id number]   HPI Patient states he is doing better on his left foot but has developed burning and shooting in his right foot and it is in the big toe joint.  He is due to see his family physician later this week   ROS      Objective:  Physical Exam  Neurovascular status intact inflammation pain around the first MPJ right fluid buildup with the left doing better F2 probability for gout of the first MPJ right with history left that is improved with treatment     Assessment:  Above gives probability of gout with inflammation     Plan:  H&P reviewed sterile prep injected the capsule of the first MPJ right 3 mg dexamethasone  Kenalog  5 mg Xylocaine  applied sterile dressing and he is going to have uric acid level tested and sed rate when he goes to see his family physician

## 2024-03-24 NOTE — Patient Instructions (Signed)
 Gout  Gout is painful swelling of your joints. Gout is a type of arthritis. It is caused by having too much uric acid in your body. Uric acid is a chemical that is made when your body breaks down substances called purines. If your body has too much uric acid, sharp crystals can form and build up in your joints. This causes pain and swelling. Gout attacks can happen quickly and be very painful (acute gout). Over time, the attacks can affect more joints and happen more often (chronic gout). What are the causes? Gout is caused by too much uric acid in your blood. This can happen because: Your kidneys do not remove enough uric acid from your blood. Your body makes too much uric acid. You eat too many foods that are high in purines. These foods include organ meats, some seafood, and beer. Trauma or stress can bring on an attack. What increases the risk? Having a family history of gout. Being male and middle-aged. Being male and having gone through menopause. Having an organ transplant. Taking certain medicines. Having certain conditions, such as: Being very overweight (obese). Lead poisoning. Kidney disease. A skin condition called psoriasis. Other risks include: Losing weight too quickly. Not having enough water in the body (being dehydrated). Drinking alcohol, especially beer. Drinking beverages that are sweetened with a type of sugar called fructose. What are the signs or symptoms? An attack of acute gout often starts at night and usually happens in just one joint. The most common place is the big toe. Other joints that may be affected include joints of the feet, ankle, knee, fingers, wrist, or elbow. Symptoms may include: Very bad pain. Warmth. Swelling. Stiffness. Tenderness. The affected joint may be very painful to touch. Shiny, red, or purple skin. Chills and fever. Chronic gout may cause symptoms more often. More joints may be involved. You may also have white or yellow lumps  (tophi) on your hands or feet or in other areas near your joints. How is this treated? Treatment for an acute attack may include medicines for pain and swelling, such as: NSAIDs, such as ibuprofen. Steroids taken by mouth or injected into a joint. Colchicine. This can be given by mouth or through an IV tube. Treatment to prevent future attacks may include: Taking small doses of NSAIDs or colchicine daily. Using a medicine that reduces uric acid levels in your blood, such as allopurinol. Making changes to your diet. You may need to see a food expert (dietitian) about what to eat and drink to prevent gout. Follow these instructions at home: During a gout attack  If told, put ice on the painful area. To do this: Put ice in a plastic bag. Place a towel between your skin and the bag. Leave the ice on for 20 minutes, 2-3 times a day. Take off the ice if your skin turns bright red. This is very important. If you cannot feel pain, heat, or cold, you have a greater risk of damage to the area. Raise the painful joint above the level of your heart as often as you can. Rest the joint as much as possible. If the joint is in your leg, you may be given crutches. Follow instructions from your doctor about what you cannot eat or drink. Avoiding future gout attacks Eat a low-purine diet. Avoid foods and drinks such as: Liver. Kidney. Anchovies. Asparagus. Herring. Mushrooms. Mussels. Beer. Stay at a healthy weight. If you want to lose weight, talk with your doctor. Do not  lose weight too fast. Start or continue an exercise plan as told by your doctor. Eating and drinking Avoid drinks sweetened by fructose. Drink enough fluids to keep your pee (urine) pale yellow. If you drink alcohol: Limit how much you have to: 0-1 drink a day for women who are not pregnant. 0-2 drinks a day for men. Know how much alcohol is in a drink. In the [place] one drink equals one 12 oz bottle of beer (355 mL), one 5 oz  glass of wine (148 mL), or one 1 oz glass of hard liquor (44 mL). General instructions Take over-the-counter and prescription medicines only as told by your doctor. Ask your doctor if you should avoid driving or using machines while you are taking your medicine. Return to your normal activities when your doctor says that it is safe. Keep all follow-up visits. Where to find more information [employer]: www.niams.http://www.myers.net/ Contact a doctor if: You have another gout attack. You still have symptoms of a gout attack after 10 days of treatment. You have problems (side effects) because of your medicines. You have chills or a fever. You have burning pain when you pee (urinate). You have pain in your lower back or belly. Get help right away if: You have very bad pain. Your pain cannot be controlled. You cannot pee. Summary Gout is painful swelling of the joints. The most common site of pain is the big toe, but it can affect other joints. Medicines and avoiding some foods can help to prevent and treat gout attacks. This information is not intended to replace advice given to you by your health care provider. Make sure you discuss any questions you have with your health care provider. Document Revised: 11/15/2020 Document Reviewed: 11/15/2020 Elsevier Patient Education  2024 [employer].

## 2024-03-26 DIAGNOSIS — M7751 Other enthesopathy of right foot: Secondary | ICD-10-CM | POA: Diagnosis not present

## 2024-03-26 MED ORDER — TRIAMCINOLONE ACETONIDE 10 MG/ML IJ SUSP
10.0000 mg | Freq: Once | INTRAMUSCULAR | Status: AC
  Administered 2024-03-26: 10 mg via INTRA_ARTICULAR

## 2024-03-26 NOTE — Addendum Note (Signed)
 Addended by: MAGDALEN PASCO RAMAN on: 03/26/2024 11:42 AM   Modules accepted: Orders
